# Patient Record
Sex: Female | Born: 1980
Health system: Southern US, Community
[De-identification: ages and names within clinical notes are randomized; demographics above are authoritative.]

## PROBLEM LIST (undated history)

## (undated) DIAGNOSIS — N6009 Solitary cyst of unspecified breast: Secondary | ICD-10-CM

## (undated) DIAGNOSIS — D259 Leiomyoma of uterus, unspecified: Secondary | ICD-10-CM

## (undated) DIAGNOSIS — T7840XA Allergy, unspecified, initial encounter: Secondary | ICD-10-CM

## (undated) DIAGNOSIS — Z8632 Personal history of gestational diabetes: Secondary | ICD-10-CM

## (undated) HISTORY — DX: Solitary cyst of unspecified breast: N60.09

## (undated) HISTORY — DX: Leiomyoma of uterus, unspecified: D25.9

## (undated) HISTORY — DX: Allergy, unspecified, initial encounter: T78.40XA

## (undated) HISTORY — PX: EYE SURGERY: SHX253

## (undated) HISTORY — DX: Personal history of gestational diabetes: Z86.32

---

## 1998-04-08 DIAGNOSIS — H269 Unspecified cataract: Secondary | ICD-10-CM

## 1998-04-08 HISTORY — DX: Unspecified cataract: H26.9

## 1998-09-16 ENCOUNTER — Emergency Department (HOSPITAL_COMMUNITY): Admission: EM | Admit: 1998-09-16 | Discharge: 1998-09-16 | Payer: Self-pay | Admitting: Emergency Medicine

## 1999-08-09 ENCOUNTER — Emergency Department (HOSPITAL_COMMUNITY): Admission: EM | Admit: 1999-08-09 | Discharge: 1999-08-09 | Payer: Self-pay | Admitting: Emergency Medicine

## 2000-07-17 ENCOUNTER — Other Ambulatory Visit: Admission: RE | Admit: 2000-07-17 | Discharge: 2000-07-17 | Payer: Self-pay | Admitting: *Deleted

## 2001-08-12 ENCOUNTER — Other Ambulatory Visit: Admission: RE | Admit: 2001-08-12 | Discharge: 2001-08-12 | Payer: Self-pay | Admitting: Obstetrics and Gynecology

## 2002-10-12 ENCOUNTER — Other Ambulatory Visit: Admission: RE | Admit: 2002-10-12 | Discharge: 2002-10-12 | Payer: Self-pay | Admitting: Obstetrics and Gynecology

## 2003-10-14 ENCOUNTER — Other Ambulatory Visit: Admission: RE | Admit: 2003-10-14 | Discharge: 2003-10-14 | Payer: Self-pay | Admitting: Obstetrics and Gynecology

## 2004-11-05 ENCOUNTER — Other Ambulatory Visit: Admission: RE | Admit: 2004-11-05 | Discharge: 2004-11-05 | Payer: Self-pay | Admitting: Obstetrics and Gynecology

## 2005-12-18 ENCOUNTER — Other Ambulatory Visit: Admission: RE | Admit: 2005-12-18 | Discharge: 2005-12-18 | Payer: Self-pay | Admitting: Obstetrics & Gynecology

## 2005-12-18 ENCOUNTER — Other Ambulatory Visit: Admission: RE | Admit: 2005-12-18 | Discharge: 2005-12-18 | Payer: Self-pay | Admitting: Obstetrics and Gynecology

## 2007-02-24 ENCOUNTER — Encounter: Admission: RE | Admit: 2007-02-24 | Discharge: 2007-02-24 | Payer: Self-pay | Admitting: Obstetrics & Gynecology

## 2007-05-04 ENCOUNTER — Inpatient Hospital Stay (HOSPITAL_COMMUNITY): Admission: AD | Admit: 2007-05-04 | Discharge: 2007-05-07 | Payer: Self-pay | Admitting: Obstetrics & Gynecology

## 2007-05-14 ENCOUNTER — Ambulatory Visit: Admission: RE | Admit: 2007-05-14 | Discharge: 2007-05-14 | Payer: Self-pay | Admitting: Obstetrics & Gynecology

## 2010-08-21 NOTE — Op Note (Signed)
Shannon Lawrence, Shannon Lawrence             ACCOUNT NO.:  [id number]   MEDICAL RECORD NO.:  [id number]          PATIENT TYPE:  INP   LOCATION:  9125                          FACILITY:  [facility]   PHYSICIAN:  Lenoard Aden, M.D.DATE OF BIRTH:  1981-03-03   DATE OF PROCEDURE:  05/05/2007  DATE OF DISCHARGE:                               OPERATIVE REPORT   INDICATION FOR OPERATIVE DELIVERY:  Prolonged second stage.   POSTOPERATIVE DIAGNOSIS:  Prolonged second stage.   PROCEDURE:  Outlet vacuum-assisted vaginal delivery.   SURGEON:  Lenoard Aden, M.D.   ANESTHESIA:  Epidural.   DRAINS:  Foley.   COUNTS:  Correct.   Central median episiotomy with repair, the patient recovery in good  condition.   DESCRIPTION OF PROCEDURE:  After apprised of risks, benefits of vacuum  assistance including small incidence of cephalohematoma, scalp  laceration, intracranial hemorrhage, Kiwi cup was placed on the fetal  vertex at +3 station, straight OA for three pulls over a central midline  episiotomy without extension, full-term living female, Apgars 8/9.  Cord  blood collected.  Placenta delivered spontaneously intact, three-vessel  cord.  Cervix without lacerations, repair with 3-0 Vicryl Rapide.  No  complications noted.  The patient tolerates procedure well and is  recovering in good condition.      Lenoard Aden, M.D.  Electronically Signed     RJT/MEDQ  D:  05/05/2007  T:  05/05/2007  Job:  [id number]

## 2010-08-21 NOTE — H&P (Signed)
[name], Shannon Lawrence             ACCOUNT NO.:  [id number]   MEDICAL RECORD NO.:  [id number]          PATIENT TYPE:  INP   LOCATION:  9166                          FACILITY:  [facility]   PHYSICIAN:  Lenoard Aden, M.D.DATE OF BIRTH:  [birth year]   DATE OF ADMISSION:  05/04/2007  DATE OF DISCHARGE:                              HISTORY & PHYSICAL   CHIEF COMPLAINT:  Early labor.   She is a 30 year old white female, G1, P0 at 38+ weeks' gestation who  presents with increased frequency of contractions today.  SHE HAS NO  KNOW LATEX ALLERGIES.  ALLERGIES TO SULFA NOTED.  She is a nonsmoker,  nondrinker.  She denies domestic or physical violence.  She has a  history of cataract surgery and wisdom tooth extraction.  She has family  history of rheumatoid arthritis, alcohol abuse, and nicotine dependence.  This is her first pregnancy.  Prenatal course is complicated by  gestational diabetes which is diet controlled and a fetus with single  umbilical artery with a normal level 2 maternal fetal ultrasound.   PHYSICAL EXAM:  She is a well-developed, well-nourished white female in  no acute distress.  HEENT:  Normal.  LUNGS:  Clear.  HEART:  Regular rate and rhythm.  ABDOMEN:  Soft, gravid, nontender.  Estimated fetal weight by ultrasound  on May 01, 2007, 7 pounds 11 ounces.  No CVA tenderness.  Cervix is  4 to 5 cm, 100% vertex -1.  EXTREMITIES:  No cords.  NEUROLOGIC:  Exam is nonfocal.  SKIN:  Intact.   NST is reactive.   Contractions are every 3 to 6 minutes.   IMPRESSION:  Term intrauterine pregnancy in early active labor.   PLAN:  Admit.  Check blood glucoses q.6.  Anticipate attempts at vaginal  delivery.      Lenoard Aden, M.D.  Electronically Signed     RJT/MEDQ  D:  05/05/2007  T:  05/05/2007  Job:  [id number]   cc:   Lenoard Aden, M.D.  Fax: [phone]

## 2010-12-27 LAB — CBC
HCT: 34.8 — ABNORMAL LOW
HCT: 39.4
MCHC: 34.4
MCHC: 35.1
MCV: 93.7
MCV: 95
Platelets: 243
RBC: 4.2
RDW: 13.6

## 2010-12-27 LAB — RPR: RPR Ser Ql: NONREACTIVE

## 2011-04-09 NOTE — L&D Delivery Note (Signed)
Delivery Note At 6:14 AM a healthy female was delivered via Vaginal, Spontaneous Delivery (Presentation: ; Occiput Anterior).  APGAR: 9, 9; weight .   Placenta status: Intact, Spontaneous.  Cord: 3 vessels with the following complications: None.  Cord pH: not done  Anesthesia: None  Episiotomy: None Lacerations: 2nd degree;Perineal Suture Repair: 2.0, 4-0 Vycryl Est. Blood Loss (mL): 300  Mom to postpartum.  Baby to nursery-stable. Consent obtained for circumcision.  Ulices Maack,MARIE-LYNE 12/19/2011, 7:09 AM

## 2011-05-22 LAB — OB RESULTS CONSOLE RPR: RPR: NONREACTIVE

## 2011-05-22 LAB — OB RESULTS CONSOLE HEPATITIS B SURFACE ANTIGEN: Hepatitis B Surface Ag: NEGATIVE

## 2011-05-22 LAB — OB RESULTS CONSOLE GBS: GBS: NEGATIVE

## 2011-05-22 LAB — OB RESULTS CONSOLE ABO/RH: RH Type: POSITIVE

## 2011-05-29 LAB — OB RESULTS CONSOLE RUBELLA ANTIBODY, IGM: Rubella: IMMUNE

## 2011-05-29 LAB — OB RESULTS CONSOLE HEPATITIS B SURFACE ANTIGEN: Hepatitis B Surface Ag: NEGATIVE

## 2011-05-29 LAB — OB RESULTS CONSOLE ABO/RH

## 2011-05-29 LAB — OB RESULTS CONSOLE GC/CHLAMYDIA
Chlamydia: NEGATIVE
Gonorrhea: NEGATIVE

## 2011-05-29 LAB — OB RESULTS CONSOLE ANTIBODY SCREEN: Antibody Screen: NEGATIVE

## 2011-10-03 ENCOUNTER — Encounter: Payer: Self-pay | Admitting: Obstetrics & Gynecology

## 2011-10-03 DIAGNOSIS — N921 Excessive and frequent menstruation with irregular cycle: Secondary | ICD-10-CM | POA: Insufficient documentation

## 2011-10-03 NOTE — Progress Notes (Deleted)
History:  31 y.o. G2P0011 here today for evaluation of abnormal uterine bleeding on Mirena IUD.  Reports having two periods a month for the past six months, separated by 2 weeks or less.  Wants further evaluation. No associated symptoms.  The following portions of the patient's history were reviewed and updated as appropriate: allergies, current medications, past family history, past medical history, past social history, past surgical history and problem list.  Review of Systems:  Pertinent items are noted in HPI.  Objective:  Physical Exam Blood pressure 136/99, pulse 99, height 5\' 6"  (1.676 m), weight 257 lb (116.574 kg). Gen: NAD Abd: Soft, nontender and nondistended Pelvic: Normal appearing external genitalia; normal appearing vaginal mucosa and cervix, IUD strings not seen.  Normal discharge.  Small uterus, no other palpable masses, no uterine or adnexal tenderness  Assessment & Plan:  Recommended treating the breakthrough bleeding with additional hormones; one pack of OCPs was given to patient.  She was told to expect breakthrough bleeding at the end of the pack, and that her irregular spotting may be alleviated using this modality. Will monitor her response.  Pelvic ultrasound also ordered to evaluate IUD position and other uterine anomalies.  It was also emphasized the irregular bleeding was a known side effect of the Mirena IUD. Bleeding precautions reviewed.   Follow up results and manage accordingly. Patient to return for follow up and annual exam   .

## 2011-10-04 NOTE — Progress Notes (Signed)
This encounter was created in error - please disregard.

## 2011-10-23 ENCOUNTER — Encounter: Payer: 59 | Attending: Obstetrics & Gynecology | Admitting: *Deleted

## 2011-10-23 VITALS — Ht 64.0 in | Wt 154.8 lb

## 2011-10-23 DIAGNOSIS — O24419 Gestational diabetes mellitus in pregnancy, unspecified control: Secondary | ICD-10-CM

## 2011-10-23 DIAGNOSIS — O99810 Abnormal glucose complicating pregnancy: Secondary | ICD-10-CM | POA: Insufficient documentation

## 2011-10-23 DIAGNOSIS — Z713 Dietary counseling and surveillance: Secondary | ICD-10-CM | POA: Insufficient documentation

## 2011-10-24 ENCOUNTER — Encounter: Payer: Self-pay | Admitting: *Deleted

## 2011-10-24 NOTE — Progress Notes (Signed)
  Patient was seen on 10/23/11 for Gestational Diabetes self-management class at the [facility]. The following learning objectives were met by the patient during this course:   States the definition of Gestational Diabetes  States why dietary management is important in controlling blood glucose  Describes the effects each nutrient has on blood glucose levels  Demonstrates ability to create a balanced meal plan  Demonstrates carbohydrate counting   States when to check blood glucose levels  Demonstrates proper blood glucose monitoring techniques  States the effect of stress and exercise on blood glucose levels  States the importance of limiting caffeine and abstaining from alcohol and smoking  Blood glucose monitor given: One Touch Ultra Mini Self Monitoring Kit  Lot # [id number]  Exp: 1/14 Blood glucose reading: 63 mg/dl  Patient instructed to monitor glucose levels: FBS: 60 - <90 2 hour: <120  *Patient received handouts:  Nutrition Diabetes and Pregnancy  Carbohydrate Counting List  Patient will be seen for follow-up as needed.

## 2011-10-24 NOTE — Patient Instructions (Signed)
Goals:  Check glucose levels per MD as instructed  Follow Gestational Diabetes Diet as instructed  Call for follow-up as needed    

## 2011-12-05 LAB — OB RESULTS CONSOLE GBS: GBS: NEGATIVE

## 2011-12-19 ENCOUNTER — Encounter (HOSPITAL_COMMUNITY): Payer: Self-pay | Admitting: *Deleted

## 2011-12-19 ENCOUNTER — Inpatient Hospital Stay (HOSPITAL_COMMUNITY)
Admission: AD | Admit: 2011-12-19 | Discharge: 2011-12-20 | DRG: 775 | Disposition: A | Discharge status: Home / Self Care | Payer: 59 | Source: Ambulatory Visit | Attending: Obstetrics & Gynecology | Admitting: Obstetrics & Gynecology

## 2011-12-19 DIAGNOSIS — O99814 Abnormal glucose complicating childbirth: Principal | ICD-10-CM | POA: Diagnosis present

## 2011-12-19 LAB — CBC
HCT: 38.6 % (ref 36.0–46.0)
Hemoglobin: 13.1 g/dL (ref 12.0–15.0)
MCH: 29.6 pg (ref 26.0–34.0)
MCHC: 33.9 g/dL (ref 30.0–36.0)
MCV: 87.1 fL (ref 78.0–100.0)
Platelets: 268 K/uL (ref 150–400)
RBC: 4.43 MIL/uL (ref 3.87–5.11)
RDW: 13.4 % (ref 11.5–15.5)
WBC: 12 K/uL — ABNORMAL HIGH (ref 4.0–10.5)

## 2011-12-19 MED ORDER — LIDOCAINE HCL (PF) 1 % IJ SOLN
INTRAMUSCULAR | Status: AC
  Filled 2011-12-19: qty 30

## 2011-12-19 MED ORDER — IBUPROFEN 600 MG PO TABS
600.0000 mg | ORAL_TABLET | Freq: Four times a day (QID) | ORAL | Status: DC
  Administered 2011-12-19 – 2011-12-20 (×6): 600 mg via ORAL
  Filled 2011-12-19 (×6): qty 1

## 2011-12-19 MED ORDER — ONDANSETRON HCL 4 MG PO TABS: 4.0000 mg | ORAL_TABLET | ORAL | Status: DC | PRN

## 2011-12-19 MED ORDER — ZOLPIDEM TARTRATE 5 MG PO TABS: 5.0000 mg | ORAL_TABLET | Freq: Every evening | ORAL | Status: DC | PRN

## 2011-12-19 MED ORDER — WITCH HAZEL-GLYCERIN EX PADS: 1.0000 "application " | MEDICATED_PAD | CUTANEOUS | Status: DC | PRN

## 2011-12-19 MED ORDER — ACETAMINOPHEN 325 MG PO TABS: 650.0000 mg | ORAL_TABLET | ORAL | Status: DC | PRN

## 2011-12-19 MED ORDER — LACTATED RINGERS IV SOLN: 500.0000 mL | Freq: Once | INTRAVENOUS | Status: DC

## 2011-12-19 MED ORDER — HYDROMORPHONE HCL PF 1 MG/ML IJ SOLN
INTRAMUSCULAR | Status: AC
  Filled 2011-12-19: qty 1

## 2011-12-19 MED ORDER — OXYTOCIN 40 UNITS IN LACTATED RINGERS INFUSION - SIMPLE MED
INTRAVENOUS | Status: AC
  Filled 2011-12-19: qty 1000

## 2011-12-19 MED ORDER — DIPHENHYDRAMINE HCL 25 MG PO CAPS: 25.0000 mg | ORAL_CAPSULE | Freq: Four times a day (QID) | ORAL | Status: DC | PRN

## 2011-12-19 MED ORDER — ONDANSETRON HCL 4 MG/2ML IJ SOLN: 4.0000 mg | INTRAMUSCULAR | Status: DC | PRN

## 2011-12-19 MED ORDER — CITRIC ACID-SODIUM CITRATE 334-500 MG/5ML PO SOLN: 30.0000 mL | ORAL | Status: DC | PRN

## 2011-12-19 MED ORDER — COMPLETENATE 29-1 MG PO CHEW
1.0000 | CHEWABLE_TABLET | Freq: Every day | ORAL | Status: DC
  Administered 2011-12-19 – 2011-12-20 (×2): 1 via ORAL
  Filled 2011-12-19 (×3): qty 1

## 2011-12-19 MED ORDER — PHENYLEPHRINE 40 MCG/ML (10ML) SYRINGE FOR IV PUSH (FOR BLOOD PRESSURE SUPPORT): 80.0000 ug | PREFILLED_SYRINGE | INTRAVENOUS | Status: DC | PRN

## 2011-12-19 MED ORDER — BENZOCAINE-MENTHOL 20-0.5 % EX AERO
1.0000 "application " | INHALATION_SPRAY | CUTANEOUS | Status: DC | PRN
  Administered 2011-12-19: 1 via TOPICAL
  Filled 2011-12-19: qty 56

## 2011-12-19 MED ORDER — FENTANYL 2.5 MCG/ML BUPIVACAINE 1/10 % EPIDURAL INFUSION (WH - ANES): 14.0000 mL/h | INTRAMUSCULAR | Status: DC

## 2011-12-19 MED ORDER — LACTATED RINGERS IV SOLN: INTRAVENOUS | Status: DC

## 2011-12-19 MED ORDER — LACTATED RINGERS IV SOLN: 500.0000 mL | INTRAVENOUS | Status: DC | PRN

## 2011-12-19 MED ORDER — EPHEDRINE 5 MG/ML INJ: 10.0000 mg | INTRAVENOUS | Status: DC | PRN

## 2011-12-19 MED ORDER — DIBUCAINE 1 % RE OINT: 1.0000 "application " | TOPICAL_OINTMENT | RECTAL | Status: DC | PRN

## 2011-12-19 MED ORDER — HYDROMORPHONE HCL PF 1 MG/ML IJ SOLN
1.0000 mg | Freq: Once | INTRAMUSCULAR | Status: AC
  Administered 2011-12-19: 1 mg via INTRAVENOUS

## 2011-12-19 MED ORDER — OXYCODONE-ACETAMINOPHEN 5-325 MG PO TABS: 1.0000 | ORAL_TABLET | ORAL | Status: DC | PRN

## 2011-12-19 MED ORDER — TETANUS-DIPHTH-ACELL PERTUSSIS 5-2.5-18.5 LF-MCG/0.5 IM SUSP: 0.5000 mL | Freq: Once | INTRAMUSCULAR | Status: DC

## 2011-12-19 MED ORDER — PRENATAL MULTIVITAMIN CH
1.0000 | ORAL_TABLET | Freq: Every day | ORAL | Status: DC
  Filled 2011-12-19: qty 1

## 2011-12-19 MED ORDER — SIMETHICONE 80 MG PO CHEW: 80.0000 mg | CHEWABLE_TABLET | ORAL | Status: DC | PRN

## 2011-12-19 MED ORDER — ONDANSETRON HCL 4 MG/2ML IJ SOLN: 4.0000 mg | Freq: Four times a day (QID) | INTRAMUSCULAR | Status: DC | PRN

## 2011-12-19 MED ORDER — DIPHENHYDRAMINE HCL 50 MG/ML IJ SOLN: 12.5000 mg | INTRAMUSCULAR | Status: DC | PRN

## 2011-12-19 MED ORDER — OXYTOCIN BOLUS FROM INFUSION
500.0000 mL | Freq: Once | INTRAVENOUS | Status: DC
  Filled 2011-12-19: qty 500

## 2011-12-19 MED ORDER — LANOLIN HYDROUS EX OINT: TOPICAL_OINTMENT | CUTANEOUS | Status: DC | PRN

## 2011-12-19 MED ORDER — SENNOSIDES-DOCUSATE SODIUM 8.6-50 MG PO TABS
2.0000 | ORAL_TABLET | Freq: Every day | ORAL | Status: DC
  Administered 2011-12-19: 2 via ORAL

## 2011-12-19 MED ORDER — OXYTOCIN 40 UNITS IN LACTATED RINGERS INFUSION - SIMPLE MED: 62.5000 mL/h | Freq: Once | INTRAVENOUS | Status: DC

## 2011-12-19 MED ORDER — IBUPROFEN 600 MG PO TABS: 600.0000 mg | ORAL_TABLET | Freq: Four times a day (QID) | ORAL | Status: DC | PRN

## 2011-12-19 MED ORDER — LIDOCAINE HCL (PF) 1 % IJ SOLN
30.0000 mL | INTRAMUSCULAR | Status: AC | PRN
  Administered 2011-12-19 (×2): 30 mL via SUBCUTANEOUS
  Filled 2011-12-19: qty 30

## 2011-12-19 MED ORDER — OXYTOCIN 40 UNITS IN LACTATED RINGERS INFUSION - SIMPLE MED: 62.5000 mL/h | INTRAVENOUS | Status: DC | PRN

## 2011-12-19 NOTE — H&P (Signed)
Subjective:  Shannon Lawrence is a 31 y.o. G14 P1A2 female with EDC 12/28/2011 at 29 and 5/[redacted] weeks gestation who is being admitted for labor management.  Her current obstetrical history is significant for GDMA1 well controled.  Patient reports no complaints.   Fetal Movement: normal.     Objective:   Vital signs in last 24 hours: Pulse Rate:  [80] 80  (09/12 0631) Resp:  [18] 18  (09/12 0631) BP: (120)/(55) 120/55 mmHg (09/12 0631) Weight:  [70.761 kg (156 lb)] 70.761 kg (156 lb) (09/12 1610)   General:   alert  Skin:   normal  HEENT:  PERRLA  Lungs:   clear to auscultation bilaterally  Heart:   regular rate and rhythm, S1, S2 normal, no murmur, click, rub or gallop  Breasts:   normal without suspicious masses, skin or nipple changes or axillary nodes and self-exam is taught and encouraged  Abdomen:  soft, non-tender; bowel sounds normal; no masses,  no organomegaly  Pelvis:  Deferred  FHT:  140's BPM  Uterine Size: size equals dates  Presentations: cephalic  Cervix:    Dilation: 8-9 cm   Effacement: 100%   Station:  0   Consistency: soft   Position: anterior   Lab Review  Rh+  AFP: Declined  One hour GTT: Abnormal , 3 hr GTT Abnormal, GDMA1  GBS neg  [place] anato wnl   Assessment/Plan:  38 and 5/[redacted] weeks gestation.  GDMA1 well controled.  AGA per [place].  Fetal well-being reassuring. Active phase labor.     Risks, benefits, alternatives and possible complications have been discussed in detail with the patient.  Pre-admission, admission, and post admission procedures and expectations were discussed in detail.  All questions answered, all appropriate consents will be signed at the Hospital. Admission is planned for today.  Anticipate vaginal delivery.

## 2011-12-19 NOTE — MAU Note (Signed)
Patients states that she started contracting at 0345 am.

## 2011-12-20 LAB — CBC
HCT: 32 % — ABNORMAL LOW (ref 36.0–46.0)
Hemoglobin: 10.9 g/dL — ABNORMAL LOW (ref 12.0–15.0)
MCV: 88.6 fL (ref 78.0–100.0)
Platelets: 195 10*3/uL (ref 150–400)
RBC: 3.61 MIL/uL — ABNORMAL LOW (ref 3.87–5.11)
WBC: 13.2 10*3/uL — ABNORMAL HIGH (ref 4.0–10.5)

## 2011-12-20 NOTE — Progress Notes (Signed)
Patient ID: Shannon Lawrence, female   DOB: 15-May-1980, 31 y.o.   MRN: [id number] PPD # 1  Subjective: Pt reports feeling well/ Pain controlled with ibuprofen Tolerating po/ Voiding without problems/ No n/v Bleeding is moderate Newborn info:  Information for the patient's newborn:  [name], [name] [[id number]]  female  / circ today/ Feeding: breast   Objective:  VS: Blood pressure 100/56, pulse 69, temperature 98.6 F (37 C), temperature source Oral, resp. rate 18.    Basename 12/20/11 0455 12/19/11 0558  WBC 13.2* 12.0*  HGB 10.9* 13.1  HCT 32.0* 38.6  PLT 195 268    Blood type: --/--/O POS (09/12 8341) Rubella: Immune (02/20 0000)    Physical Exam:  General: A & O x 3  alert, cooperative and no distress CV: Regular rate and rhythm Resp: clear Abdomen: soft, nontender, normal bowel sounds Uterine Fundus: firm, below umbilicus, nontender Perineum: minimal edema; well approximated Lochia: moderate Ext: no edema, redness or tenderness in the calves or thighs   A/P: PPD # 1/ G4P2022/ S/P:spontaneous vaginal delivery w/2nd degree repair Doing well; stable for early d/c home, however 31yo at home with fever and to see peds this am.  Will wait for peds report and possible clearance for d/c later today Continue routine post partum orders    Demetrius Revel, MSN, [facility] 12/20/2011, 9:15 AM

## 2012-01-28 NOTE — Discharge Summary (Signed)
Obstetric Discharge Summary  Reason for Admission: onset of labor and FT gestation with hx GDM A1 Prenatal Procedures: ultrasound Intrapartum Procedures: spontaneous vaginal delivery Postpartum Procedures: none Complications-Operative and Postpartum: 2nd degree perineal laceration Hemoglobin  Date Value Range Status  12/20/2011 10.9* 12.0 - 15.0 g/dL Final     REPEATED TO VERIFY     DELTA CHECK NOTED     HCT  Date Value Range Status  12/20/2011 32.0* 36.0 - 46.0 % Final    Physical Exam:  General: alert, cooperative and no distress Lochia: appropriate Uterine Fundus: firm Incision: na DVT Evaluation: No evidence of DVT seen on physical exam.  Discharge Diagnoses: Term Pregnancy-delivered  Discharge Information: Date: 01/19/12 Activity: pelvic rest Diet: routine Medications: PNV, Ibuprofen and Percocet Condition: stable Instructions: refer to practice specific booklet Discharge to: home   Newborn Data: Live born female on 12/19/11 Birth Weight: 6 lb 10.9 oz (3030 g) APGAR: 9, 9  Home with mother.  Darrah Dredge K 01/19/12

## 2014-02-07 ENCOUNTER — Encounter (HOSPITAL_COMMUNITY): Payer: Self-pay | Admitting: *Deleted

## 2016-05-09 ENCOUNTER — Ambulatory Visit (INDEPENDENT_AMBULATORY_CARE_PROVIDER_SITE_OTHER): Payer: Commercial Managed Care - HMO | Admitting: Family Medicine

## 2016-05-09 ENCOUNTER — Encounter: Payer: Self-pay | Admitting: Family Medicine

## 2016-05-09 VITALS — BP 108/72 | HR 78 | Temp 98.6°F | Ht 64.0 in | Wt 135.0 lb

## 2016-05-09 DIAGNOSIS — B354 Tinea corporis: Secondary | ICD-10-CM | POA: Diagnosis not present

## 2016-05-09 DIAGNOSIS — R1084 Generalized abdominal pain: Secondary | ICD-10-CM | POA: Diagnosis not present

## 2016-05-09 LAB — CBC WITH DIFFERENTIAL/PLATELET
Basophils Absolute: 0.1 10*3/uL (ref 0.0–0.1)
Basophils Relative: 1.1 % (ref 0.0–3.0)
Eosinophils Absolute: 0.3 10*3/uL (ref 0.0–0.7)
Eosinophils Relative: 4.8 % (ref 0.0–5.0)
HCT: 41.8 % (ref 36.0–46.0)
Hemoglobin: 14.2 g/dL (ref 12.0–15.0)
Lymphocytes Relative: 19.5 % (ref 12.0–46.0)
Lymphs Abs: 1.4 10*3/uL (ref 0.7–4.0)
MCHC: 33.9 g/dL (ref 30.0–36.0)
MCV: 90.4 fl (ref 78.0–100.0)
Monocytes Absolute: 0.5 10*3/uL (ref 0.1–1.0)
Monocytes Relative: 7 % (ref 3.0–12.0)
Neutro Abs: 4.9 10*3/uL (ref 1.4–7.7)
Neutrophils Relative %: 67.6 % (ref 43.0–77.0)
Platelets: 366 10*3/uL (ref 150.0–400.0)
RBC: 4.62 Mil/uL (ref 3.87–5.11)
RDW: 12.9 % (ref 11.5–15.5)
WBC: 7.2 10*3/uL (ref 4.0–10.5)

## 2016-05-09 LAB — COMPREHENSIVE METABOLIC PANEL
ALT: 15 U/L (ref 0–35)
AST: 24 U/L (ref 0–37)
Albumin: 4.7 g/dL (ref 3.5–5.2)
Alkaline Phosphatase: 56 U/L (ref 39–117)
BUN: 11 mg/dL (ref 6–23)
CO2: 28 mEq/L (ref 19–32)
Calcium: 9.8 mg/dL (ref 8.4–10.5)
Chloride: 106 mEq/L (ref 96–112)
Creatinine, Ser: 0.8 mg/dL (ref 0.40–1.20)
GFR: 85.61 mL/min (ref >60.00)
Glucose, Bld: 92 mg/dL (ref 70–99)
Potassium: 4.4 mEq/L (ref 3.5–5.1)
Sodium: 139 mEq/L (ref 135–145)
Total Bilirubin: 0.8 mg/dL (ref 0.2–1.2)
Total Protein: 7.8 g/dL (ref 6.0–8.3)

## 2016-05-09 LAB — POCT URINE PREGNANCY: Preg Test, Ur: NEGATIVE

## 2016-05-09 MED ORDER — CLOTRIMAZOLE-BETAMETHASONE 1-0.05 % EX CREA: 1.0000 "application " | TOPICAL_CREAM | Freq: Two times a day (BID) | CUTANEOUS | 0 refills | Status: DC

## 2016-05-09 NOTE — Progress Notes (Signed)
Pre visit review using our clinic review tool, if applicable. No additional management support is needed unless otherwise documented below in the visit note. 

## 2016-05-09 NOTE — Progress Notes (Signed)
Shannon Lawrence is a 36 y.o. female is here to [facility].   History of Present Illness:    1. Generalized abdominal pain: Mild, "feels more like a sensitive stomach." Had possible GE a few days ago and just hasn't improved to normal yet. Daughter has upcoming surgical procedure, so she is also wondering if this is IBS. No fever, N/V/D/C. Eating and drinking almost normally now. No travel. No new foods. No sick contacts.   2. Tinea corporis: Left axilla, x months, itchy. She has tried "psoriasis cream" without relief. No other lesions or rashes. No Hx of the same.     PMHx, SurgHx, SocialHx, Medications, and Allergies were reviewed in the Visit Navigator and updated as appropriate.    Past Medical History:  Diagnosis Date  . Cyst (solitary) of breast left breast  . Diabetes mellitus   . Migraine     History reviewed. No pertinent surgical history.  Family History  Problem Relation Age of Onset  . Brain cancer Maternal Grandmother   . Cancer Other   . COPD Other   . Hyperlipidemia Other     Social History  Substance Use Topics  . Smoking status: Never Smoker  . Smokeless tobacco: Never Used  . Alcohol use Yes     Comment: social     Current Medications and Allergies:    Current Outpatient Prescriptions:  .  clotrimazole-betamethasone (LOTRISONE) cream, Apply 1 application topically 2 (two) times daily., Disp: 30 g, Rfl: 0   Allergies  Allergen Reactions  . Sulfa Drugs Cross Reactors Other (See Comments)    Reaction unknown      Patient Information Form: Screening and ROS    Do you feel safe in relationships? yes PHQ-2: negative  Review of Systems  General:  Negative for nexplained weight loss, fever Skin: Negative for new or changing mole, sore that won't heal HEENT: Negative for trouble hearing, trouble seeing, ringing in ears, mouth sores, hoarseness, change in voice, dysphagia CV:  Negative for chest pain, dyspnea, edema, palpitations Resp:  Negative for cough, dyspnea, hemoptysis GI: Negative for nausea, vomiting, diarrhea, constipation, melena, hematochezia GU: Negative for dysuria, incontinence, urinary hesitance, hematuria MSK: Negative for muscle cramps or aches, joint pain or swelling Neuro: Negative for headaches, weakness, numbness, dizziness, passing out/fainting Psych: Negative for depression, anxiety, memory problems   Vitals:   Vitals:   05/09/16 0855  BP: 108/72  Pulse: 78  Temp: 98.6 F (37 C)  TempSrc: Oral  SpO2: 100%  Weight: 135 lb (61.2 kg)  Height: 5\' 4"  (1.626 m)     Body mass index is 23.17 kg/m.   Physical Exam:    General: Alert, cooperative, appears stated age and no distress.  HEENT:  Normocephalic, without obvious abnormality, atraumatic. Conjunctivae/corneas clear. PERRL, EOM's intact. Normal TM's and external ear canals both ears. Nares normal. Septum midline. Mucosa normal. No drainage or sinus tenderness. Lips, mucosa, and tongue normal; teeth and gums normal.  Lungs: Clear to auscultation bilaterally.  Heart:: Regular rate and rhythm, S1, S2 normal, no murmur, click, rub or gallop.  Abdomen: Soft, non-tender; bowel sounds normal; no masses, no organomegaly.  Extremities: Extremities normal, atraumatic, no cyanosis or edema.  Pulses: 2+ and symmetric.  Skin: Skin color, texture, turgor normal. Left axilla with tinea.  Neurologic: Alert and oriented X 3, normal strength and tone. Normal symmetric. reflexes. Normal coordination and gait.  Psych: Alert,oriented, in NAD with a full range of affect, normal behavior and no psychotic features  Results for orders placed or performed in visit on 05/09/16 (from the past 48 hour(s))  POCT urine pregnancy     Status: Normal   Collection Time: 05/09/16  9:59 AM  Result Value Ref Range   Preg Test, Ur Negative Negative     Assessment and Plan:    Shannon Lawrence was seen today for establish care.  Diagnoses and all orders for this  visit:  Generalized abdominal pain Comments: Likely due to recent GI illness. DDx anxiety-related, food-related. Labs pending. Precuations and red flags reviewed. Orders: -     CBC with Differential/Platelet -     Comprehensive metabolic panel -     POCT urine pregnancy  Tinea corporis Comments: Trial of topical treatment. Orders: -     clotrimazole-betamethasone (LOTRISONE) cream; Apply 1 application topically 2 (two) times daily.   . Reviewed expectations re: course of current medical issues. . Discussed self-management of symptoms. . Outlined signs and symptoms indicating need for more acute intervention. . Patient verbalized understanding and all questions were answered. . See orders for this visit as documented in the electronic medical record. . Patient received an After Visit Summary.  Records requested if needed. I spent ** minutes with this patient, greater than 50% was face-to-face time counseling regarding the above diagnoses.    Briscoe Deutscher, [place], [place] 05/09/2016   Follow-up: Return if symptoms worsen or fail to improve.  Meds ordered this encounter  Medications  . clotrimazole-betamethasone (LOTRISONE) cream    Sig: Apply 1 application topically 2 (two) times daily.    Dispense:  30 g    Refill:  0   Medications Discontinued During This Encounter  Medication Reason  . Prenatal Vit-Fe Fumarate-FA (PRENATAL MULTIVITAMIN) TABS Error   Orders Placed This Encounter  Procedures  . CBC with Differential/Platelet  . Comprehensive metabolic panel  . POCT urine pregnancy

## 2016-07-18 ENCOUNTER — Encounter: Payer: Self-pay | Admitting: Obstetrics & Gynecology

## 2016-07-25 ENCOUNTER — Ambulatory Visit (INDEPENDENT_AMBULATORY_CARE_PROVIDER_SITE_OTHER): Payer: Commercial Managed Care - HMO | Admitting: Obstetrics & Gynecology

## 2016-07-25 ENCOUNTER — Encounter: Payer: Self-pay | Admitting: Obstetrics & Gynecology

## 2016-07-25 VITALS — BP 128/80 | Ht 64.0 in | Wt 130.0 lb

## 2016-07-25 DIAGNOSIS — N920 Excessive and frequent menstruation with regular cycle: Secondary | ICD-10-CM

## 2016-07-25 DIAGNOSIS — Z01411 Encounter for gynecological examination (general) (routine) with abnormal findings: Secondary | ICD-10-CM | POA: Diagnosis not present

## 2016-07-25 DIAGNOSIS — D259 Leiomyoma of uterus, unspecified: Secondary | ICD-10-CM | POA: Diagnosis not present

## 2016-07-25 LAB — CBC
HEMATOCRIT: 40.1 % (ref 35.0–45.0)
HEMOGLOBIN: 13.4 g/dL (ref 11.7–15.5)
MCH: 30.6 pg (ref 27.0–33.0)
MCHC: 33.4 g/dL (ref 32.0–36.0)
MCV: 91.6 fL (ref 80.0–100.0)
MPV: 9 fL (ref 7.5–12.5)
Platelets: 376 10*3/uL (ref 140–400)
RBC: 4.38 MIL/uL (ref 3.80–5.10)
RDW: 12.7 % (ref 11.0–15.0)
WBC: 6.5 10*3/uL (ref 3.8–10.8)

## 2016-07-25 NOTE — Addendum Note (Signed)
Addended by: Thurnell Garbe A on: 07/25/2016 12:22 PM   Modules accepted: Orders

## 2016-07-25 NOTE — Progress Notes (Signed)
Shannon Lawrence 04/25/1980 [id number]   History:    36 y.o.  G4P2A2L2 Married.  Sons are 4 yo and 4th grade.  Established patient for annual gyn exam.  Using condoms.  Menses regular q month, but progressively heavier flow.  H/O Fibroid.  Last period very heavy with severe dysmeno.  Pain in Rt hip on-off, will see Ortho.  Breasts wnl.  Past medical history,surgical history, family history and social history were all reviewed and documented in the EPIC chart.  Gynecologic History Patient's last menstrual period was 07/17/2016. Contraception: condoms, hoping for Vasectomy Last Pap: Uncertain 9528 or 4132. Results were: normal Last mammogram: Never.  Obstetric History OB History  Gravida Para Term Preterm AB Living  4 2 2   2 2   SAB TAB Ectopic Multiple Live Births  2       1    # Outcome Date GA Lbr Len/2nd Weight Sex Delivery Anes PTL Lv  4 Term 12/19/11 [redacted]w[redacted]d 02:19 / 00:10 6 lb 10.9 oz (3.03 kg) M Vag-Spont None  LIV  3 SAB 09/18/10          2 SAB 04/19/10          1 Term 04/20/07               ROS: A ROS was performed and pertinent positives and negatives are included in the history.  GENERAL: No fevers or chills. HEENT: No change in vision, no earache, sore throat or sinus congestion. NECK: No pain or stiffness. CARDIOVASCULAR: No chest pain or pressure. No palpitations. PULMONARY: No shortness of breath, cough or wheeze. GASTROINTESTINAL: No abdominal pain, nausea, vomiting or diarrhea, melena or bright red blood per rectum. GENITOURINARY: No urinary frequency, urgency, hesitancy or dysuria. MUSCULOSKELETAL: No joint or muscle pain, no back pain, no recent trauma. DERMATOLOGIC: No rash, no itching, no lesions. ENDOCRINE: No polyuria, polydipsia, no heat or cold intolerance. No recent change in weight. HEMATOLOGICAL: No anemia or easy bruising or bleeding. NEUROLOGIC: No headache, seizures, numbness, tingling or weakness. PSYCHIATRIC: No depression, no loss of interest in  normal activity or change in sleep pattern.     Exam:   BP 128/80   Ht 5\' 4"  (1.626 m)   Wt 130 lb (59 kg)   LMP 07/17/2016   BMI 22.31 kg/m   Body mass index is 22.31 kg/m.  General appearance : Well developed well nourished female. No acute distress HEENT: Eyes: no retinal hemorrhage or exudates,  Neck supple, trachea midline, no carotid bruits, no thyroidmegaly Lungs: Clear to auscultation, no rhonchi or wheezes, or rib retractions  Heart: Regular rate and rhythm, no murmurs or gallops Breast:Examined in sitting and supine position were symmetrical in appearance, no palpable masses or tenderness,  no skin retraction, no nipple inversion, no nipple discharge, no skin discoloration, no axillary or supraclavicular lymphadenopathy Abdomen: no palpable masses or tenderness, no rebound or guarding Extremities: no edema or skin discoloration or tenderness  Pelvic:  Bartholin, Urethra, Skene Glands: Within normal limits             Vagina: No gross lesions or discharge  Cervix: No gross lesions or discharge.  Pap reflex done.  Uterus  AV, increased in size, with a probable fibroid towards the Rt side.  Mobile, non-tender.  Adnexa  Without masses or tenderness  Anus and perineum  normal     Assessment/Plan:  36 y.o. female for annual exam.  1. Encounter for gynecological examination with abnormal finding Normal  Aex/Gyn exam except for Uterine Fibroid.  Pending Pap reflex.  2. Menorrhagia with regular cycle Progressively heavier periods, but reg qmth.  Increased dysmeno.  Management of heavy periods discussed including Progestin Pill, Mirena IUD, Endometrial Ablation, Myomectomy and Hysterectomy. - CBC - US Transvaginal Non-OB; Future  3. Uterine leiomyoma, unspecified location Known Myoma, will evaluate location and size in the context of heavy menses. - US Transvaginal Non-OB; Future  Counseling >50% x 10 min on above issues.  [name] MD, 10:04 AM 07/25/2016

## 2016-07-25 NOTE — Patient Instructions (Signed)
Your Annual/Gyn exam was normal today except for a Uterine Fibroid probably causing your heavy menses.  A Pap reflex and CBC were done, we will let you know the results as soon as available.  We will finalize our discussion on the treatment of your heavy menses at the time of the Pelvic US in about 2 weeks.  It was a pleasure to see you today!

## 2016-07-29 LAB — PAP IG W/ RFLX HPV ASCU

## 2016-08-07 ENCOUNTER — Ambulatory Visit (INDEPENDENT_AMBULATORY_CARE_PROVIDER_SITE_OTHER): Payer: Commercial Managed Care - HMO | Admitting: Obstetrics & Gynecology

## 2016-08-07 ENCOUNTER — Other Ambulatory Visit: Payer: Self-pay | Admitting: Obstetrics & Gynecology

## 2016-08-07 ENCOUNTER — Ambulatory Visit (INDEPENDENT_AMBULATORY_CARE_PROVIDER_SITE_OTHER): Payer: Commercial Managed Care - HMO

## 2016-08-07 DIAGNOSIS — R938 Abnormal findings on diagnostic imaging of other specified body structures: Secondary | ICD-10-CM | POA: Diagnosis not present

## 2016-08-07 DIAGNOSIS — N852 Hypertrophy of uterus: Secondary | ICD-10-CM | POA: Diagnosis not present

## 2016-08-07 DIAGNOSIS — N920 Excessive and frequent menstruation with regular cycle: Secondary | ICD-10-CM

## 2016-08-07 DIAGNOSIS — D251 Intramural leiomyoma of uterus: Secondary | ICD-10-CM

## 2016-08-07 DIAGNOSIS — R9389 Abnormal findings on diagnostic imaging of other specified body structures: Secondary | ICD-10-CM

## 2016-08-07 DIAGNOSIS — D259 Leiomyoma of uterus, unspecified: Secondary | ICD-10-CM

## 2016-08-07 DIAGNOSIS — D250 Submucous leiomyoma of uterus: Secondary | ICD-10-CM

## 2016-08-07 DIAGNOSIS — R102 Pelvic and perineal pain: Secondary | ICD-10-CM | POA: Diagnosis not present

## 2016-08-07 NOTE — Patient Instructions (Signed)
Your Pelvic US showed an Intramural Myoma towards the right side measuring 9.8 x 8.4 x 9.5 cm.  The Endometrial line was at 15.1 mm which is thick.  Rt and Lt Ovaries normal.  No Fluid in the pelvis.    Treatment options both Hormonal with Progestins and Surgical were discussed.  You will find information about DepoProvera below.    Given the thick Endometrial lining, please schedule an Endometrial biopsy as soon as possible if you opt not to have a surgical treatment.  Medroxyprogesterone injection [Contraceptive] What is this medicine? MEDROXYPROGESTERONE (me DROX ee proe JES te rone) contraceptive injections prevent pregnancy. They provide effective birth control for 3 months. Depo-subQ Provera 104 is also used for treating pain related to endometriosis and to treat women with heavy or painful menses. This medicine may be used for other purposes; ask your health care provider or pharmacist if you have questions. COMMON BRAND NAME(S): Depo-Provera, Depo-subQ Provera 104 What should I tell my health care provider before I take this medicine? They need to know if you have any of these conditions: -frequently drink alcohol -asthma -blood vessel disease or a history of a blood clot in the lungs or legs -bone disease such as osteoporosis -breast cancer -diabetes -eating disorder (anorexia nervosa or bulimia) -high blood pressure -HIV infection or AIDS -kidney disease -liver disease -mental depression -migraine -seizures (convulsions) -stroke -tobacco smoker -vaginal bleeding -an unusual or allergic reaction to medroxyprogesterone, other hormones, medicines, foods, dyes, or preservatives -pregnant or trying to get pregnant -breast-feeding How should I use this medicine? Depo-Provera Contraceptive injection is given into a muscle. Depo-subQ Provera 104 injection is given under the skin. These injections are given by a health care professional. You must not be pregnant before getting an  injection. The injection is usually given during the first 5 days after the start of a menstrual period or 6 weeks after delivery of a baby. Talk to your pediatrician regarding the use of this medicine in children. Special care may be needed. These injections have been used in female children who have started having menstrual periods. Overdosage: If you think you have taken too much of this medicine contact a poison control center or emergency room at once. NOTE: This medicine is only for you. Do not share this medicine with others. What if I miss a dose? Try not to miss a dose. You must get an injection once every 3 months to maintain birth control. If you cannot keep an appointment, call and reschedule it. If you wait longer than 13 weeks between Depo-Provera contraceptive injections or longer than 14 weeks between Depo-subQ Provera 104 injections, you could get pregnant. Use another method for birth control if you miss your appointment. You may also need a pregnancy test before receiving another injection. What may interact with this medicine? Do not take this medicine with any of the following medications: -bosentan This medicine may also interact with the following medications: -aminoglutethimide -antibiotics or medicines for infections, especially rifampin, rifabutin, rifapentine, and griseofulvin -aprepitant -barbiturate medicines such as phenobarbital or primidone -bexarotene -carbamazepine -medicines for seizures like ethotoin, felbamate, oxcarbazepine, phenytoin, topiramate -modafinil -St. John's wort This list may not describe all possible interactions. Give your health care provider a list of all the medicines, herbs, non-prescription drugs, or dietary supplements you use. Also tell them if you smoke, drink alcohol, or use illegal drugs. Some items may interact with your medicine. What should I watch for while using this medicine? This drug does not protect  you against HIV infection  (AIDS) or other sexually transmitted diseases. Use of this product may cause you to lose calcium from your bones. Loss of calcium may cause weak bones (osteoporosis). Only use this product for more than 2 years if other forms of birth control are not right for you. The longer you use this product for birth control the more likely you will be at risk for weak bones. Ask your health care professional how you can keep strong bones. You may have a change in bleeding pattern or irregular periods. Many females stop having periods while taking this drug. If you have received your injections on time, your chance of being pregnant is very low. If you think you may be pregnant, see your health care professional as soon as possible. Tell your health care professional if you want to get pregnant within the next year. The effect of this medicine may last a long time after you get your last injection. What side effects may I notice from receiving this medicine? Side effects that you should report to your doctor or health care professional as soon as possible: -allergic reactions like skin rash, itching or hives, swelling of the face, lips, or tongue -breast tenderness or discharge -breathing problems -changes in vision -depression -feeling faint or lightheaded, falls -fever -pain in the abdomen, chest, groin, or leg -problems with balance, talking, walking -unusually weak or tired -yellowing of the eyes or skin Side effects that usually do not require medical attention (report to your doctor or health care professional if they continue or are bothersome): -acne -fluid retention and swelling -headache -irregular periods, spotting, or absent periods -temporary pain, itching, or skin reaction at site where injected -weight gain This list may not describe all possible side effects. Call your doctor for medical advice about side effects. You may report side effects to FDA at 1-800-FDA-1088. Where should I  keep my medicine? This does not apply. The injection will be given to you by a health care professional. NOTE: This sheet is a summary. It may not cover all possible information. If you have questions about this medicine, talk to your doctor, pharmacist, or health care provider.  2018 Elsevier/Gold Standard (2008-04-15 18:37:56)

## 2016-08-07 NOTE — Progress Notes (Signed)
    Shannon Lawrence 03-06-1981 [id number]        36 y.o.  [id number]  RP:  Heavy periods and pelvic pain with Fibroids for Pelvic US   Past medical history,surgical history, problem list, medications, allergies, family history and social history were all reviewed and documented in the EPIC chart.  Directed ROS with pertinent positives and negatives documented in the history of present illness/assessment and plan.  Exam:  There were no vitals filed for this visit. General appearance:  Normal  Pelvic US today:  T/V and T/A:  Anteverted uterus with large IM Myoma 9.5 x 8.4 x 9.8 cm with uterine and Endometrial displacement.  Endometrial line thickened at 15.1 mm.  Rt and Lt Ovaries normal.  No FF in CDS.  Assessment/Plan:  36 y.o. [id number]   1. Menorrhagia with regular cycle Different medical/hormonal approaches discussed including Progestin IUD, BCPs and DepoProvera.  Will read on DepoProvera and call back when certain about which approach she prefers.  Endometrium is thickened at 15.1 mm, will proceed with EBx if non-surgical approach is chosen.  2. Intramural and submucous leiomyoma of uterus Large Myoma.  Surgical treatment with Myomectomy or Hysterectomy discussed.  Will consider after attempting medical/hormonal treatment if heavy menses or Rt pelvic pain persist.  DepoLupron also discussed.  3. Female pelvic pain Rt hip pain intermittently.  Could be caused by large Fibroid on Rt side or hip problem.  Will see Ortho.    Counseling >50% x 15 min on above issues and treatment options.  [name] MD, 4:03 PM 08/07/2016

## 2016-08-14 ENCOUNTER — Telehealth: Payer: Self-pay | Admitting: *Deleted

## 2016-08-14 NOTE — Telephone Encounter (Signed)
Pt called to follow up from [place] on 08/07/16 pt said she would like to proceed with hysterectomy. She asked if another office visit is the next step to discuss this? Please advise

## 2016-08-15 ENCOUNTER — Telehealth: Payer: Self-pay

## 2016-08-15 NOTE — Telephone Encounter (Signed)
Please schedule Robotic Total Laparoscopic Hysterectomy with Bilateral Salpingectomy at [facility].  2 hours.  Patient needs to schedule Preop with me.

## 2016-08-15 NOTE — Telephone Encounter (Signed)
I spoke with patient and we discussed dates for surgery. She prefers to wait until late July and she chose 11/05/16 at 7:30am at [facility].  Pre op was scheduled with Dr. Carlean Jews. We discussed her ins benefits and her estimated surgery prepymt and I will be sending her a financial letter by mail.

## 2016-08-15 NOTE — Telephone Encounter (Signed)
Left a detailed message on pt voicemail

## 2016-10-08 ENCOUNTER — Encounter: Payer: Self-pay | Admitting: Anesthesiology

## 2016-10-28 ENCOUNTER — Encounter: Payer: Self-pay | Admitting: Obstetrics & Gynecology

## 2016-10-28 ENCOUNTER — Encounter (HOSPITAL_COMMUNITY): Payer: Self-pay

## 2016-10-28 ENCOUNTER — Encounter (HOSPITAL_COMMUNITY)
Admission: RE | Admit: 2016-10-28 | Discharge: 2016-10-28 | Disposition: A | Discharge status: Home / Self Care | Payer: 59 | Source: Ambulatory Visit | Attending: Obstetrics & Gynecology | Admitting: Obstetrics & Gynecology

## 2016-10-28 ENCOUNTER — Ambulatory Visit (INDEPENDENT_AMBULATORY_CARE_PROVIDER_SITE_OTHER): Payer: 59 | Admitting: Obstetrics & Gynecology

## 2016-10-28 VITALS — BP 132/86

## 2016-10-28 DIAGNOSIS — Z0183 Encounter for blood typing: Secondary | ICD-10-CM | POA: Insufficient documentation

## 2016-10-28 DIAGNOSIS — D259 Leiomyoma of uterus, unspecified: Secondary | ICD-10-CM | POA: Insufficient documentation

## 2016-10-28 DIAGNOSIS — N920 Excessive and frequent menstruation with regular cycle: Secondary | ICD-10-CM | POA: Insufficient documentation

## 2016-10-28 DIAGNOSIS — D251 Intramural leiomyoma of uterus: Secondary | ICD-10-CM | POA: Diagnosis not present

## 2016-10-28 DIAGNOSIS — Z01812 Encounter for preprocedural laboratory examination: Secondary | ICD-10-CM | POA: Insufficient documentation

## 2016-10-28 DIAGNOSIS — R102 Pelvic and perineal pain: Secondary | ICD-10-CM

## 2016-10-28 LAB — CBC
HEMATOCRIT: 39.3 % (ref 36.0–46.0)
Hemoglobin: 13.5 g/dL (ref 12.0–15.0)
MCH: 30.5 pg (ref 26.0–34.0)
MCHC: 34.4 g/dL (ref 30.0–36.0)
MCV: 88.9 fL (ref 78.0–100.0)
Platelets: 341 10*3/uL (ref 150–400)
RBC: 4.42 MIL/uL (ref 3.87–5.11)
RDW: 13.5 % (ref 11.5–15.5)
WBC: 7.6 10*3/uL (ref 4.0–10.5)

## 2016-10-28 LAB — TYPE AND SCREEN
ABO/RH(D): O POS
Antibody Screen: NEGATIVE

## 2016-10-28 NOTE — Patient Instructions (Signed)
Your procedure is scheduled on:  Tuesday, November 05, 2016  Enter through the Main Entrance of [facility] at:  6:00 AM  Pick up the phone at the desk and dial [phone].  Call this number if you have problems the morning of surgery: [phone].  Remember: Do NOT eat food or drink after:  Midnight Monday  Take these medicines the morning of surgery with a SIP OF WATER:  None  Stop ALL herbal medications at this time  Do NOT smoke the day of surgery.  Do NOT wear jewelry (body piercing), metal hair clips/bobby pins, make-up, artifical eyelashes or nail polish. Do NOT wear lotions, powders, or perfumes.  You may wear deodorant. Do NOT shave for 48 hours prior to surgery. Do NOT bring valuables to the hospital. Contacts, dentures, or bridgework may not be worn into surgery.  Leave suitcase in car.  After surgery it may be brought to your room.  For patients admitted to the hospital, checkout time is 11:00 AM the day of discharge.   Bring a copy of your healthcare power of attorney and living will documents.

## 2016-10-28 NOTE — Progress Notes (Signed)
    Shannon Lawrence [birth year] [id number]        36 y.o.  [id number] Married  RP:  Large symptomatic uterine fibroid with menorrhagia and pelvic pain   HPI:  Persistent heavy menses with Rt Pelvic pain.    Pelvic US 08/07/2016:  T/V and T/A:  Anteverted uterus with large IM Myoma 9.5 x 8.4 x 9.8 cm with uterine and Endometrial displacement.  Endometrial line thickened at 15.1 mm.  Rt and Lt Ovaries normal.  No FF in CDS.    Past medical history,surgical history, problem list, medications, allergies, family history and social history were all reviewed and documented in the EPIC chart.  Directed ROS with pertinent positives and negatives documented in the history of present illness/assessment and plan.  Exam:  Vitals:   10/28/16 0902  BP: 132/86   General appearance:  Normal  Gyn exam:  See previous visits  Pelvic US as above  Assessment/Plan:  36 y.o. [id number]   1. Intramural leiomyoma of uterus Large symptomatic uterine myoma.  Declines medical approaches.  No desire to preserve fertility.  Decision to proceed with Robotic TLH/Bilateral Salpingectomy.  Preservation of Ovaries.  Surgery and risks reviewed as below.  2. Menorrhagia with regular cycle Persistent heavy menses associated with large IM Myoma.  3. Female pelvic pain Rt pelvic pain probably secondary to large uterine myoma towards Rt side.                        Patient was counseled as to the risk of surgery to include the following:  1. Infection (prohylactic antibiotics will be administered)  2. DVT/Pulmonary Embolism (prophylactic pneumo compression stockings will be used)  3.Trauma to internal organs requiring additional surgical procedure to repair any injury to     Internal organs requiring perhaps additional hospitalization days.  4.Hemmorhage requiring transfusion and blood products which carry risks such as anaphylactic reaction, hepatitis and AIDS  Patient had received literature information on the  procedure scheduled and all her questions were answered and fully accepts all risk.   Shannon LavoieMD9:34 AMTD     Shannon Bruins MD, 9:11 AM 10/28/2016

## 2016-10-28 NOTE — Patient Instructions (Signed)
1. Intramural leiomyoma of uterus Large symptomatic uterine myoma.  Declines medical approaches.  No desire to preserve fertility.  Decision to proceed with Robotic TLH/Bilateral Salpingectomy.  Preservation of Ovaries.  Surgery and risks reviewed as below.  2. Menorrhagia with regular cycle Persistent heavy menses associated with large IM Myoma.  3. Female pelvic pain Rt pelvic pain probably secondary to large uterine myoma towards Rt side.                        Patient was counseled as to the risk of surgery to include the following:  1. Infection (prohylactic antibiotics will be administered)  2. DVT/Pulmonary Embolism (prophylactic pneumo compression stockings will be used)  3.Trauma to internal organs requiring additional surgical procedure to repair any injury to     Internal organs requiring perhaps additional hospitalization days.  4.Hemmorhage requiring transfusion and blood products which carry risks such as anaphylactic reaction, hepatitis and AIDS  Patient had received literature information on the procedure scheduled and all her questions were answered and fully accepts all risk.   Shannon Lawrence, good to see you today!

## 2016-11-04 NOTE — Anesthesia Preprocedure Evaluation (Signed)
Anesthesia Evaluation  Patient identified by MRN, date of birth, ID band Patient awake    Reviewed: Allergy & Precautions, H&P , Patient's Chart, lab work & pertinent test results, reviewed documented beta blocker date and time   Airway Mallampati: II  TM Distance: >3 FB Neck ROM: full    Dental no notable dental hx.    Pulmonary    Pulmonary exam normal breath sounds clear to auscultation       Cardiovascular  Rhythm:regular Rate:Normal     Neuro/Psych    GI/Hepatic   Endo/Other  diabetes  Renal/GU      Musculoskeletal   Abdominal   Peds  Hematology   Anesthesia Other Findings   Reproductive/Obstetrics                             Anesthesia Physical Anesthesia Plan  ASA: II  Anesthesia Plan: General   Post-op Pain Management:    Induction: Intravenous  PONV Risk Score and Plan: 3 and Ondansetron, Dexamethasone, Midazolam and Propofol infusion  Airway Management Planned: Oral ETT  Additional Equipment:   Intra-op Plan:   Post-operative Plan: Extubation in OR  Informed Consent: I have reviewed the patients History and Physical, chart, labs and discussed the procedure including the risks, benefits and alternatives for the proposed anesthesia with the patient or authorized representative who has indicated his/her understanding and acceptance.   Dental Advisory Given  Plan Discussed with: CRNA and Surgeon  Anesthesia Plan Comments: (  )        Anesthesia Quick Evaluation

## 2016-11-05 ENCOUNTER — Inpatient Hospital Stay (HOSPITAL_COMMUNITY)
Admission: AD | Admit: 2016-11-05 | Discharge: 2016-11-07 | DRG: 743 | Disposition: A | Discharge status: Home / Self Care | Payer: 59 | Source: Ambulatory Visit | Attending: Obstetrics & Gynecology | Admitting: Obstetrics & Gynecology

## 2016-11-05 ENCOUNTER — Ambulatory Visit (HOSPITAL_COMMUNITY): Payer: 59 | Admitting: Anesthesiology

## 2016-11-05 ENCOUNTER — Encounter (HOSPITAL_COMMUNITY)
Admission: AD | Disposition: A | Discharge status: Home / Self Care | Payer: Self-pay | Source: Ambulatory Visit | Attending: Obstetrics & Gynecology

## 2016-11-05 ENCOUNTER — Encounter (HOSPITAL_COMMUNITY): Payer: Self-pay

## 2016-11-05 DIAGNOSIS — D259 Leiomyoma of uterus, unspecified: Secondary | ICD-10-CM | POA: Diagnosis not present

## 2016-11-05 DIAGNOSIS — D251 Intramural leiomyoma of uterus: Principal | ICD-10-CM | POA: Diagnosis present

## 2016-11-05 DIAGNOSIS — Z9889 Other specified postprocedural states: Secondary | ICD-10-CM

## 2016-11-05 DIAGNOSIS — Z5331 Laparoscopic surgical procedure converted to open procedure: Secondary | ICD-10-CM | POA: Diagnosis not present

## 2016-11-05 DIAGNOSIS — R102 Pelvic and perineal pain: Secondary | ICD-10-CM | POA: Diagnosis present

## 2016-11-05 DIAGNOSIS — N920 Excessive and frequent menstruation with regular cycle: Secondary | ICD-10-CM | POA: Diagnosis present

## 2016-11-05 HISTORY — PX: ABDOMINAL HYSTERECTOMY: SHX81

## 2016-11-05 HISTORY — PX: ROBOTIC ASSISTED TOTAL HYSTERECTOMY WITH SALPINGECTOMY: SHX6679

## 2016-11-05 HISTORY — PX: BILATERAL SALPINGECTOMY: SHX5743

## 2016-11-05 LAB — PREGNANCY, URINE: Preg Test, Ur: NEGATIVE

## 2016-11-05 SURGERY — ROBOTIC ASSISTED TOTAL HYSTERECTOMY WITH SALPINGECTOMY
Anesthesia: General | Site: Abdomen

## 2016-11-05 MED ORDER — FENTANYL CITRATE (PF) 100 MCG/2ML IJ SOLN
INTRAMUSCULAR | Status: DC | PRN
  Administered 2016-11-05 (×2): 50 ug via INTRAVENOUS
  Administered 2016-11-05: 100 ug via INTRAVENOUS
  Administered 2016-11-05 (×2): 50 ug via INTRAVENOUS

## 2016-11-05 MED ORDER — IBUPROFEN 600 MG PO TABS
600.0000 mg | ORAL_TABLET | Freq: Four times a day (QID) | ORAL | Status: DC | PRN
  Administered 2016-11-06 – 2016-11-07 (×3): 600 mg via ORAL
  Filled 2016-11-05 (×3): qty 1

## 2016-11-05 MED ORDER — DEXAMETHASONE SODIUM PHOSPHATE 4 MG/ML IJ SOLN
INTRAMUSCULAR | Status: AC
Start: 2016-11-05 — End: 2016-11-05
  Filled 2016-11-05: qty 1

## 2016-11-05 MED ORDER — ROPIVACAINE HCL 5 MG/ML IJ SOLN
INTRAMUSCULAR | Status: AC
  Filled 2016-11-05: qty 30

## 2016-11-05 MED ORDER — HYDROMORPHONE HCL 1 MG/ML IJ SOLN
0.2500 mg | INTRAMUSCULAR | Status: DC | PRN
  Administered 2016-11-05 (×3): 0.5 mg via INTRAVENOUS

## 2016-11-05 MED ORDER — LIDOCAINE HCL (CARDIAC) 20 MG/ML IV SOLN
INTRAVENOUS | Status: AC
  Filled 2016-11-05: qty 5

## 2016-11-05 MED ORDER — BUPIVACAINE HCL (PF) 0.25 % IJ SOLN
INTRAMUSCULAR | Status: DC | PRN
  Administered 2016-11-05: 12 mL

## 2016-11-05 MED ORDER — SCOPOLAMINE 1 MG/3DAYS TD PT72
MEDICATED_PATCH | TRANSDERMAL | Status: AC
  Administered 2016-11-05: 1.5 mg via TRANSDERMAL
  Filled 2016-11-05: qty 1

## 2016-11-05 MED ORDER — SCOPOLAMINE 1 MG/3DAYS TD PT72
1.0000 | MEDICATED_PATCH | Freq: Once | TRANSDERMAL | Status: DC
  Administered 2016-11-05: 1.5 mg via TRANSDERMAL

## 2016-11-05 MED ORDER — OXYCODONE-ACETAMINOPHEN 5-325 MG PO TABS
1.0000 | ORAL_TABLET | ORAL | Status: DC | PRN
  Administered 2016-11-06: 2 via ORAL
  Filled 2016-11-05: qty 2

## 2016-11-05 MED ORDER — PROPOFOL 10 MG/ML IV BOLUS
INTRAVENOUS | Status: AC
  Filled 2016-11-05: qty 20

## 2016-11-05 MED ORDER — SODIUM CHLORIDE 0.9 % IJ SOLN
INTRAMUSCULAR | Status: AC
  Filled 2016-11-05: qty 20

## 2016-11-05 MED ORDER — ARTIFICIAL TEARS OPHTHALMIC OINT
TOPICAL_OINTMENT | OPHTHALMIC | Status: AC
  Filled 2016-11-05: qty 7

## 2016-11-05 MED ORDER — ROCURONIUM BROMIDE 100 MG/10ML IV SOLN
INTRAVENOUS | Status: DC | PRN
  Administered 2016-11-05: 30 mg via INTRAVENOUS
  Administered 2016-11-05: 20 mg via INTRAVENOUS
  Administered 2016-11-05 (×2): 10 mg via INTRAVENOUS
  Administered 2016-11-05: 20 mg via INTRAVENOUS

## 2016-11-05 MED ORDER — BUPIVACAINE HCL (PF) 0.25 % IJ SOLN
INTRAMUSCULAR | Status: AC
  Filled 2016-11-05: qty 30

## 2016-11-05 MED ORDER — HYDROMORPHONE HCL 1 MG/ML IJ SOLN
INTRAMUSCULAR | Status: AC
  Administered 2016-11-05: 0.5 mg via INTRAVENOUS
  Filled 2016-11-05: qty 1

## 2016-11-05 MED ORDER — DEXAMETHASONE SODIUM PHOSPHATE 10 MG/ML IJ SOLN
INTRAMUSCULAR | Status: DC | PRN
  Administered 2016-11-05: 4 mg via INTRAVENOUS

## 2016-11-05 MED ORDER — KETOROLAC TROMETHAMINE 30 MG/ML IJ SOLN
INTRAMUSCULAR | Status: DC | PRN
  Administered 2016-11-05: 30 mg via INTRAVENOUS

## 2016-11-05 MED ORDER — LACTATED RINGERS IV SOLN
INTRAVENOUS | Status: DC
  Administered 2016-11-05 – 2016-11-06 (×4): via INTRAVENOUS

## 2016-11-05 MED ORDER — MIDAZOLAM HCL 2 MG/2ML IJ SOLN
INTRAMUSCULAR | Status: AC
  Filled 2016-11-05: qty 2

## 2016-11-05 MED ORDER — MIDAZOLAM HCL 2 MG/2ML IJ SOLN
INTRAMUSCULAR | Status: DC | PRN
  Administered 2016-11-05: 1 mg via INTRAVENOUS

## 2016-11-05 MED ORDER — SODIUM CHLORIDE 0.9 % IJ SOLN
INTRAMUSCULAR | Status: AC
  Filled 2016-11-05: qty 10

## 2016-11-05 MED ORDER — HYDROMORPHONE HCL 1 MG/ML IJ SOLN
1.0000 mg | INTRAMUSCULAR | Status: DC | PRN
  Administered 2016-11-05: 1 mg via INTRAVENOUS
  Filled 2016-11-05: qty 1

## 2016-11-05 MED ORDER — KETOROLAC TROMETHAMINE 30 MG/ML IJ SOLN
INTRAMUSCULAR | Status: AC
  Filled 2016-11-05: qty 1

## 2016-11-05 MED ORDER — LIDOCAINE HCL (CARDIAC) 20 MG/ML IV SOLN
INTRAVENOUS | Status: DC | PRN
  Administered 2016-11-05: 30 mg via INTRAVENOUS
  Administered 2016-11-05: 70 mg via INTRAVENOUS

## 2016-11-05 MED ORDER — CEFAZOLIN SODIUM-DEXTROSE 2-4 GM/100ML-% IV SOLN
2.0000 g | INTRAVENOUS | Status: AC
  Administered 2016-11-05: 2 g via INTRAVENOUS

## 2016-11-05 MED ORDER — SOD CITRATE-CITRIC ACID 500-334 MG/5ML PO SOLN
ORAL | Status: AC
  Administered 2016-11-05: 30 mL via ORAL
  Filled 2016-11-05: qty 15

## 2016-11-05 MED ORDER — HYDROMORPHONE HCL 1 MG/ML IJ SOLN
INTRAMUSCULAR | Status: AC
  Filled 2016-11-05: qty 0.5

## 2016-11-05 MED ORDER — ROCURONIUM BROMIDE 100 MG/10ML IV SOLN
INTRAVENOUS | Status: AC
  Filled 2016-11-05: qty 1

## 2016-11-05 MED ORDER — HYDROMORPHONE HCL 1 MG/ML IJ SOLN: 0.5000 mg | INTRAMUSCULAR | Status: DC | PRN

## 2016-11-05 MED ORDER — SOD CITRATE-CITRIC ACID 500-334 MG/5ML PO SOLN
30.0000 mL | ORAL | Status: AC
  Administered 2016-11-05: 30 mL via ORAL

## 2016-11-05 MED ORDER — SUGAMMADEX SODIUM 200 MG/2ML IV SOLN
INTRAVENOUS | Status: AC
  Filled 2016-11-05: qty 2

## 2016-11-05 MED ORDER — ONDANSETRON HCL 4 MG/2ML IJ SOLN
INTRAMUSCULAR | Status: AC
  Filled 2016-11-05: qty 2

## 2016-11-05 MED ORDER — ONDANSETRON HCL 4 MG/2ML IJ SOLN
INTRAMUSCULAR | Status: DC | PRN
  Administered 2016-11-05: 4 mg via INTRAVENOUS

## 2016-11-05 MED ORDER — SUGAMMADEX SODIUM 200 MG/2ML IV SOLN
INTRAVENOUS | Status: DC | PRN
  Administered 2016-11-05: 118.2 mg via INTRAVENOUS

## 2016-11-05 MED ORDER — FENTANYL CITRATE (PF) 250 MCG/5ML IJ SOLN
INTRAMUSCULAR | Status: AC
  Filled 2016-11-05: qty 5

## 2016-11-05 MED ORDER — LACTATED RINGERS IV SOLN
INTRAVENOUS | Status: DC
  Administered 2016-11-05 (×2): via INTRAVENOUS

## 2016-11-05 MED ORDER — FENTANYL CITRATE (PF) 100 MCG/2ML IJ SOLN
INTRAMUSCULAR | Status: AC
  Filled 2016-11-05: qty 2

## 2016-11-05 MED ORDER — PROPOFOL 10 MG/ML IV BOLUS
INTRAVENOUS | Status: DC | PRN
  Administered 2016-11-05: 170 mg via INTRAVENOUS

## 2016-11-05 SURGICAL SUPPLY — 59 items
ADH SKN CLS APL DERMABOND .7 (GAUZE/BANDAGES/DRESSINGS) ×3
BARRIER ADHS 3X4 INTERCEED (GAUZE/BANDAGES/DRESSINGS) IMPLANT
BLADE SURG 10 STRL SS (BLADE) ×2 IMPLANT
BRR ADH 4X3 ABS CNTRL BYND (GAUZE/BANDAGES/DRESSINGS)
CATH FOLEY 3WAY  5CC 16FR (CATHETERS) ×1
CATH FOLEY 3WAY 5CC 16FR (CATHETERS) ×3 IMPLANT
CLOTH BEACON ORANGE TIMEOUT ST (SAFETY) ×4 IMPLANT
CONT PATH 16OZ SNAP LID 3702 (MISCELLANEOUS) ×4 IMPLANT
COVER BACK TABLE 60X90IN (DRAPES) ×8 IMPLANT
COVER LIGHT HANDLE  1/PK (MISCELLANEOUS) ×2
COVER LIGHT HANDLE 1/PK (MISCELLANEOUS) IMPLANT
COVER TIP SHEARS 8 DVNC (MISCELLANEOUS) ×3 IMPLANT
COVER TIP SHEARS 8MM DA VINCI (MISCELLANEOUS) ×1
DECANTER SPIKE VIAL GLASS SM (MISCELLANEOUS) ×8 IMPLANT
DEFOGGER SCOPE WARMER CLEARIFY (MISCELLANEOUS) ×4 IMPLANT
DERMABOND ADVANCED (GAUZE/BANDAGES/DRESSINGS) ×1
DERMABOND ADVANCED .7 DNX12 (GAUZE/BANDAGES/DRESSINGS) ×3 IMPLANT
DURAPREP 26ML APPLICATOR (WOUND CARE) ×4 IMPLANT
ELECT REM PT RETURN 9FT ADLT (ELECTROSURGICAL) ×4
ELECTRODE REM PT RTRN 9FT ADLT (ELECTROSURGICAL) ×3 IMPLANT
GLOVE BIOGEL PI IND STRL 7.0 (GLOVE) ×15 IMPLANT
GLOVE BIOGEL PI INDICATOR 7.0 (GLOVE) ×5
KIT ACCESSORY DA VINCI DISP (KITS) ×1
KIT ACCESSORY DVNC DISP (KITS) ×3 IMPLANT
LEGGING LITHOTOMY PAIR STRL (DRAPES) ×5 IMPLANT
OCCLUDER COLPOPNEUMO (BALLOONS) ×4 IMPLANT
PACK ROBOT WH (CUSTOM PROCEDURE TRAY) ×4 IMPLANT
PACK ROBOTIC GOWN (GOWN DISPOSABLE) ×4 IMPLANT
PACK TRENDGUARD 450 HYBRID PRO (MISCELLANEOUS) IMPLANT
PAD PREP 24X48 CUFFED NSTRL (MISCELLANEOUS) ×8 IMPLANT
PROTECTOR NERVE ULNAR (MISCELLANEOUS) ×8 IMPLANT
SET CYSTO W/LG BORE CLAMP LF (SET/KITS/TRAYS/PACK) IMPLANT
SET IRRIG TUBING LAPAROSCOPIC (IRRIGATION / IRRIGATOR) ×4 IMPLANT
SET TRI-LUMEN FLTR TB AIRSEAL (TUBING) ×4 IMPLANT
SPONGE LAP 18X18 X RAY DECT (DISPOSABLE) ×2 IMPLANT
SUT ETHIBOND 0 (SUTURE) IMPLANT
SUT PLAIN 2 0 (SUTURE) ×4
SUT PLAIN ABS 2-0 CT1 27XMFL (SUTURE) IMPLANT
SUT VIC AB 0 CT1 18XCR BRD8 (SUTURE) IMPLANT
SUT VIC AB 0 CT1 27 (SUTURE) ×8
SUT VIC AB 0 CT1 27XBRD ANBCTR (SUTURE) IMPLANT
SUT VIC AB 0 CT1 36 (SUTURE) ×2 IMPLANT
SUT VIC AB 0 CT1 8-18 (SUTURE) ×8
SUT VIC AB 4-0 PS2 27 (SUTURE) ×9 IMPLANT
SUT VICRYL 0 TIES 12 18 (SUTURE) ×1 IMPLANT
SUT VICRYL 0 UR6 27IN ABS (SUTURE) ×6 IMPLANT
SUT VLOC 180 0 9IN  GS21 (SUTURE)
SUT VLOC 180 0 9IN GS21 (SUTURE) IMPLANT
SUT VLOC 180 2-0 6IN GS21 (SUTURE) IMPLANT
SYR 50ML LL SCALE MARK (SYRINGE) ×4 IMPLANT
SYSTEM CONVERTIBLE TROCAR (TROCAR) ×4 IMPLANT
TIP UTERINE 6.7X10CM GRN DISP (MISCELLANEOUS) ×1 IMPLANT
TOWEL OR 17X24 6PK STRL BLUE (TOWEL DISPOSABLE) ×12 IMPLANT
TRENDGUARD 450 HYBRID PRO PACK (MISCELLANEOUS) ×4
TROCAR 12M 150ML BLUNT (TROCAR) ×4 IMPLANT
TROCAR DISP BLADELESS 8 DVNC (TROCAR) ×3 IMPLANT
TROCAR DISP BLADELESS 8MM (TROCAR) ×1
TROCAR PORT AIRSEAL 5X120 (TROCAR) ×1 IMPLANT
TROCAR PORT AIRSEAL 8X120 (TROCAR) ×3 IMPLANT

## 2016-11-05 NOTE — Anesthesia Postprocedure Evaluation (Signed)
Anesthesia Post Note  Patient: Shannon Lawrence  Procedure(s) Performed: Procedure(s) (LRB): ATTEMPTED ROBOTIC ASSISTED TOTAL HYSTERECTOMY WITH BILATERAL SALPINGECTOMY (Bilateral) TOTAL ABDOMINAL HYSTERECTOMY (N/A) BILATERAL SALPINGECTOMY (Bilateral)     Anesthesia Type: General    Last Vitals:  Vitals:   11/05/16 1330 11/05/16 1415  BP: 125/65 121/61  Pulse: 64 74  Resp: 13 16  Temp:  36.6 C    Last Pain:  Vitals:   11/05/16 1415  TempSrc:   PainSc: (P) 4    Pain Goal: Patients Stated Pain Goal: 3 (11/05/16 0615)               Lyndle Herrlich EDWARD

## 2016-11-05 NOTE — Op Note (Signed)
Operative Note  11/05/2016  12:31 PM  PATIENT:  Shannon Lawrence  36 y.o. female  PRE-OPERATIVE DIAGNOSIS:  Large Fibroid with Pelvic pain and Menorrhagia  POST-OPERATIVE DIAGNOSIS:  Large Fibroid with Pelvic pain and Menorrhagia  PROCEDURE:  Procedure(s): ATTEMPTED ROBOTIC ASSISTED TOTAL HYSTERECTOMY WITH BILATERAL SALPINGECTOMY, CONVERTED TO TOTAL ABDOMINAL HYSTERECTOMY WITH BILATERAL SALPINGECTOMY  SURGEON:  Surgeon(s): [name], MD Fontaine, Belinda Block, MD  ANESTHESIA:   general  FINDINGS:  Large Intramural Myoma at lower uterine segment  DESCRIPTION OF OPERATION:   Under general anesthesia with endotracheal intubation the patient is in lithotomy position. She is prepped with ChloraPrep on the abdomen and with Betadine on the suprapubic, vulvar and vaginal areas. She is draped as usual. The Foley is inserted in the bladder. The vaginal exam reveals a large uterus about 12 cm mobile no adnexal mass. The weighted speculum is inserted in the vagina and the anterior lip of the cervix is grasped with a tenaculum. Dilation of the cervix with Hegar dilators, hysterometry at 10 cm and insertion of a #8 Rumi with a medium Koh ring.  The tenaculum and weighted speculum are removed. We go to the abdomen. The supraumbilical area is infiltrated with Marcaine one quarter plain. A 1.5 cm incision is made with the scalpel.  The aponeurosis is grasped with cokers.  It is opened with Mayo scissors.  The parietal peritoneum is opened bluntly with the finger. A pursestring stitch of Vicryl 0 is done on the aponeurosis. The Sheryle Hail is inserted at that level and up pneumoperitoneum is created with CO2.  The camera is inserted and the anterior wall of the abdomen is inspected.  A semicircular configuration is used for ports placement.  Infiltration of Marcaine one quarter plain at each site and small incisions with the scalpel.  2 robotic ports are inserted on the right side under direct vision and a  robotic port on the left lower side, with the 5 mm assistant port on the left upper side, also inserted under direct vision.  The patient is position in deep Trendelenburg. The robot his docked from the right side. Robotic instruments are inserted under direct vision with the Endo Shears scissor in the first arm. The PK in the second arm and the Prograsp clamp in the third arm.  We go to the console.      Inspection of the abdominopelvic cavities reveal a uterus with a large fibroid at the lower uterine segment just above the cervix.  The fibroid is about 10 cm and it is intramural and submucosal.  Both tubes are normal and both ovaries are normal in size and appearance.  No pathology is seen otherwise in the pelvis and the abdomen is normal with a normal liver and gallbladder.  Both ureters are seen and normal anatomy position with good peristalsis.  We start on the left side with cauterization and section of the left mesosalpinx. We then cauterized and sectioned the left utero-ovarian ligament. We cauterized and sectioned the left round ligament. We proceed the same way on the right side.  We were able to open the visceral peritoneum anteriorly and lowered the bladder past the fibroid.  The anatomy at the level of the uterine arteries is completely changed by the large low fibroid.  This is too large and too low to successfully lift it and gain access to the uterine arteries above the cervix.  The fibroid is pressing on both pelvic walls and does not allow any space.  We therefore make an attempt to do a myomectomy.  The uterine artery pedicle is cauterized high up on the myoma and the stretched myometrium is opened above that level anteriorly into the right.  The myoma is enucleated partially.  It is very vascular.  Hemostasis is well controlled as we go along but because of the risk of hemorrhage and the risk of trauma, we decide to interrupt our attempt to proceed robotically and rather to convert to a safer  opened abdominal approach.  The robotic instruments are therefore removed.  The robot is undocked.  The ports are removed.  The CO2 is evacuated.  The deep Trendelenburg is reversed.  The supra umbilical incision is closed by attaching the pursestring stitch. All incisions are closed with a Vicryl 4-0 in a subcuticular stitch. Dermabond was added on all incisions.   We prepare for a laparotomy.  Dr. Abbie Sons is called in to assist.      We make a Pfannenstiel incision.  The electrocautery is used on the adipose tissue. We opened the aponeurosis transversely with Mayo scissors. The recti muscles are separated from the aponeurosis at the midline.  The parietal peritoneum is opened with Metzenbaums scissors.  It is opened longitudinally.  The Balfour retractor is put in place with the bladder blade. 3 laps are inserted in the abdomen to retain the bowels up.  Kelly clamps are applied at the uterine cornua.  We complete the myomectomy.  We further descended the bladder past the cervix.  The left uterine artery is clamped with a curved in a and a straight. We section with the Mayo scissors. We suture with a Vicryl 0. We proceed the same way on the right side.  We then descended along the left side of the uterus with a straight Heaney, resection with the scalpel and we suture with a Heaney stitch of Vicryl 0.  We descended further on the right side with straight Heaney's section with the scalpel and suture with a Heaney stitch of Vicryl 0.  We reached the angle of the vagina on the left side we clamp with a curve Heaney, we section with Mayo scissors and suture with a Heaney stitch of Vicryl 0. We keep that stitch on a hemostat. We did the same thing on the right side. We complete the section of the vagina anteriorly and posteriorly in the middle.  The uterus is removed with the cervix and both tubes and sent to pathology together with the large myoma.  Pictures are taken of the specimens.  We closed the vaginal  vault with figure-of-eight's of Vicryl 0.  The uterosacral ligaments are already included with the vaginal angles.  Hemostasis is adequate at all levels.  We irrigated and suctioned the abdominopelvic cavities.  The 3 laps are removed. The Balfour retractor is removed.  The parietal peritoneum is closed with a running suture of Vicryl 2-0.  The aponeurosis is closed with 2 half running sutures of Vicryl 0. The aponeurosis is closed with a running suture of plain 2-0.  The skin is closed with a subcuticular suture of Vicryl 4-0. Dermabond was added. A honeycomb dressing is put in place with pressure dressing.  The patient is brought to recovery room in good and stable status.          ESTIMATED BLOOD LOSS: 300 cc   Intake/Output Summary (Last 24 hours) at 11/05/16 1231 Last data filed at 11/05/16 1105  Gross per 24 hour  Intake  1500 ml  Output              450 ml  Net             1050 ml     BLOOD ADMINISTERED:none   LOCAL MEDICATIONS USED:  MARCAINE     SPECIMEN:  Source of Specimen:  Uterus (Fibroid) with Cervix and both Tubes  DISPOSITION OF SPECIMEN:  PATHOLOGY  COUNTS:  YES  PLAN OF CARE: Transfer to PACU  Marie-Lyne LavoieMD12:31 PM

## 2016-11-05 NOTE — OR Nursing (Signed)
Decision made by Dr. Dellis Filbert to convert to abdominal surgery at 0935. Family notified. Fan and steel incision made at 1001.

## 2016-11-05 NOTE — Transfer of Care (Signed)
Immediate Anesthesia Transfer of Care Note  Patient: Shannon Lawrence  Procedure(s) Performed: Procedure(s): ATTEMPTED ROBOTIC ASSISTED TOTAL HYSTERECTOMY WITH BILATERAL SALPINGECTOMY (Bilateral) TOTAL ABDOMINAL HYSTERECTOMY (N/A) BILATERAL SALPINGECTOMY (Bilateral)  Patient Location: PACU  Anesthesia Type:General  Level of Consciousness: awake, sedated and patient cooperative  Airway & Oxygen Therapy: Patient Spontanous Breathing and Patient connected to nasal cannula oxygen  Post-op Assessment: Report given to RN and Post -op Vital signs reviewed and stable  Post vital signs: Reviewed and stable  Last Vitals:  Vitals:   11/05/16 0615  BP: 118/79  Pulse: 76  Resp: 14  Temp: 36.4 C    Last Pain:  Vitals:   11/05/16 0615  TempSrc: Oral      Patients Stated Pain Goal: 3 (15/61/53 7943)  Complications: No apparent anesthesia complications

## 2016-11-05 NOTE — Anesthesia Procedure Notes (Signed)
Procedure Name: Intubation Date/Time: 11/05/2016 7:30 AM Performed by: Tobin Chad Pre-anesthesia Checklist: Patient identified, Emergency Drugs available, Suction available and Patient being monitored Patient Re-evaluated:Patient Re-evaluated prior to induction Oxygen Delivery Method: Circle system utilized and Simple face mask Preoxygenation: Pre-oxygenation with 100% oxygen Induction Type: IV induction Ventilation: Mask ventilation without difficulty Laryngoscope Size: Mac and 3 Grade View: Grade I Tube size: 7.0 mm Number of attempts: 1 Airway Equipment and Method: Stylet Placement Confirmation: ETT inserted through vocal cords under direct vision,  positive ETCO2 and breath sounds checked- equal and bilateral Secured at: 20 (com) cm Tube secured with: Tape Dental Injury: Teeth and Oropharynx as per pre-operative assessment

## 2016-11-05 NOTE — H&P (Signed)
Shannon Lawrence is an 36 y.o. female G56P2A2 Married  RP:  Large symptomatic Myomas with Menorrhagia and Pelvic pain for Robotic TLH/Bilateral Salpingectomy  HPI:  No recent change.  Pertinent Gynecological History: Menses: Heavy Contraception: condoms Blood transfusions: none Sexually transmitted diseases: no past history Previous GYN Procedures:  None Last mammogram: None  Last pap: Normal   Menstrual History:  Patient's last menstrual period was 10/15/2016 (approximate).    Past Medical History:  Diagnosis Date  . Cyst (solitary) of breast left breast   patient denies this is a part of her medical history 10/28/16  . Diabetes mellitus    gestational    Past Surgical History:  Procedure Laterality Date  . EYE SURGERY     2000 left eye    Family History  Problem Relation Age of Onset  . Brain cancer Maternal Grandmother   . Cancer Other   . COPD Other   . Hyperlipidemia Other   . Hypertension Mother   . Cancer Maternal Grandfather        stomach  . Stroke Paternal Grandmother   . Diabetes Paternal Grandfather     Social History:  reports that she has never smoked. She has never used smokeless tobacco. She reports that she drinks alcohol. She reports that she does not use drugs.  Allergies:  Allergies  Allergen Reactions  . Sulfa Drugs Cross Reactors Other (See Comments)    Reaction unknown    No prescriptions prior to admission.    ROS Neg  Blood pressure 118/79, pulse 76, temperature 97.6 F (36.4 C), temperature source Oral, resp. rate 14, last menstrual period 10/15/2016, SpO2 100 %, unknown if currently breastfeeding. Physical Exam  Results for orders placed or performed during the hospital encounter of 11/05/16 (from the past 24 hour(s))  Pregnancy, urine     Status: None   Collection Time: 11/05/16  6:00 AM  Result Value Ref Range   Preg Test, Ur NEGATIVE NEGATIVE    Pelvic US 08/2016: T/V and T/A:  Anteverted uterus with large IM Myoma  9.5 x 8.4 x 9.8 cm with uterine and Endometrial displacement.  Endometrial line thickened at 15.1 mm.  Rt and Lt Ovaries normal.  No FF in CDS.    Assessment/Plan:  1. Intramural leiomyoma of uterus Large symptomatic uterine myoma.  Declines medical approaches.  No desire to preserve fertility.  Decision to proceed with Robotic TLH/Bilateral Salpingectomy.  Preservation of Ovaries.  Surgery and risks reviewed as below.  2. Menorrhagia with regular cycle Persistent heavy menses associated with large IM Myoma.  3. Female pelvic pain Rt pelvic pain probably secondary to large uterine myoma towards Rt side.                        Patient was counseled as to the risk of surgery to include the following:  1. Infection (prohylactic antibiotics will be administered)  2. DVT/Pulmonary Embolism (prophylactic pneumo compression stockings will be used)  3.Trauma to internal organs requiring additional surgical procedure to repair any injury to     Internal organs requiring perhaps additional hospitalization days.  4.Hemmorhage requiring transfusion and blood products which carry risks such as anaphylactic reaction, hepatitis and AIDS  Patient had received literature information on the procedure scheduled and all her questions were answered and fully accepts all risk.   Marie-Lyne Ada [place]bury 11/05/2016, 7:15 AM

## 2016-11-06 ENCOUNTER — Encounter (HOSPITAL_COMMUNITY): Payer: Self-pay | Admitting: Obstetrics & Gynecology

## 2016-11-06 LAB — CBC
HCT: 28.1 % — ABNORMAL LOW (ref 36.0–46.0)
Hemoglobin: 9.7 g/dL — ABNORMAL LOW (ref 12.0–15.0)
MCH: 31 pg (ref 26.0–34.0)
MCHC: 34.5 g/dL (ref 30.0–36.0)
MCV: 89.8 fL (ref 78.0–100.0)
PLATELETS: 248 10*3/uL (ref 150–400)
RBC: 3.13 MIL/uL — AB (ref 3.87–5.11)
RDW: 13.7 % (ref 11.5–15.5)
WBC: 9.8 10*3/uL (ref 4.0–10.5)

## 2016-11-07 MED ORDER — OXYCODONE-ACETAMINOPHEN 7.5-325 MG PO TABS: 1.0000 | ORAL_TABLET | ORAL | 0 refills | Status: DC | PRN

## 2016-11-07 NOTE — Progress Notes (Signed)
POD#1 TAH/Bilateral Salpingectomy  Subjective: Patient reports tolerating PO and no problems voiding.  Not feeling passage of gas yet.  Ambulating well.  Pain controled with Percocet.  Objective: I have reviewed patient's vital signs.  vital signs, medications and labs.  Vitals:   11/06/16 2335 11/06/16 2359  BP: (!) 101/41 111/61  Pulse: 77 76  Resp: 17   Temp: 97.6 F (36.4 C)    I/O last 3 completed shifts: In: 2006.3 [I.V.:2006.3] Out: 1776 [BLTJQ:3009; Blood:300] No intake/output data recorded.  Results for orders placed or performed during the hospital encounter of 11/05/16 (from the past 24 hour(s))  CBC     Status: Abnormal   Collection Time: 11/06/16  5:18 AM  Result Value Ref Range   WBC 9.8 4.0 - 10.5 K/uL   RBC 3.13 (L) 3.87 - 5.11 MIL/uL   Hemoglobin 9.7 (L) 12.0 - 15.0 g/dL   HCT 28.1 (L) 36.0 - 46.0 %   MCV 89.8 78.0 - 100.0 fL   MCH 31.0 26.0 - 34.0 pg   MCHC 34.5 30.0 - 36.0 g/dL   RDW 13.7 11.5 - 15.5 %   Platelets 248 150 - 400 K/uL    EXAM General: alert and no distress Resp: clear to auscultation bilaterally Cardio: regular rate and rhythm GI: soft, non-tender; bowel sounds normal; no masses,  no organomegaly Extremities: no edema, redness or tenderness in the calves or thighs Vaginal Bleeding: none  Assessment: s/p Procedure(s): ATTEMPTED ROBOTIC ASSISTED TOTAL HYSTERECTOMY WITH BILATERAL SALPINGECTOMY TOTAL ABDOMINAL HYSTERECTOMY BILATERAL SALPINGECTOMY: stable, progressing well and tolerating diet  Plan: Encourage ambulation Probable d/c home tomorrow  LOS: 2 days    [name], MD 11/07/2016 12:39 AM    11/07/2016, 12:39 AM

## 2016-11-07 NOTE — Progress Notes (Signed)
POD#2  Subjective: Patient reports + flatus.    Objective: I have reviewed patient's vital signs.  vital signs, medications, labs and pathology.  Vitals:   11/06/16 2359 11/07/16 0349  BP: 111/61 (!) 112/57  Pulse: 76 69  Resp:  18  Temp:  98.9 F (37.2 C)   I/O last 3 completed shifts: In: -  Out: 1101 [Urine:1101] No intake/output data recorded.  No results found for this or any previous visit (from the past 24 hour(s)).  EXAM General: alert and cooperative Resp: clear to auscultation bilaterally Cardio: regular rate and rhythm GI: soft, non-tender; bowel sounds normal; no masses,  no organomegaly Extremities: Normal, no oedema, NT Vaginal Bleeding: none  Patho:  Benign.  Uterus with Fibroid/tubes Assessment: s/p Procedure(s): ATTEMPTED ROBOTIC ASSISTED TOTAL HYSTERECTOMY WITH BILATERAL SALPINGECTOMY TOTAL ABDOMINAL HYSTERECTOMY BILATERAL SALPINGECTOMY: progressing well  Plan: Discharge home  LOS: 2 days    [name], MD 11/07/2016 8:03 AM    11/07/2016, 8:03 AM

## 2016-11-07 NOTE — Discharge Instructions (Signed)
Abdominal Hysterectomy, Care After °This sheet gives you information about how to care for yourself after your procedure. Your health care provider may also give you more specific instructions. If you have problems or questions, contact your health care provider. °What can I expect after the procedure? °After your procedure, it is common to have: °· Pain. °· Fatigue. °· Poor appetite. °· Less interest in sex. °· Vaginal bleeding and discharge. You may need to use a sanitary napkin after this procedure. ° °Follow these instructions at home: °Bathing °· Do not take baths, swim, or use a hot tub until your health care provider approves. Ask your health care provider if you can take showers. You may only be allowed to take sponge baths for bathing. °· Keep the bandage (dressing) dry until your health care provider says it can be removed. °Incision care °· Follow instructions from your health care provider about how to take care of your incision. Make sure you: °? Wash your hands with soap and water before you change your bandage (dressing). If soap and water are not available, use hand sanitizer. °? Change your dressing as told by your health care provider. °? Leave stitches (sutures), skin glue, or adhesive strips in place. These skin closures may need to stay in place for 2 weeks or longer. If adhesive strip edges start to loosen and curl up, you may trim the loose edges. Do not remove adhesive strips completely unless your health care provider tells you to do that. °· Check your incision area every day for signs of infection. Check for: °? Redness, swelling, or pain. °? Fluid or blood. °? Warmth. °? Pus or a bad smell. °Activity °· Do gentle, daily exercises as told by your health care provider. You may be told to take short walks every day and go farther each time. °· Do not lift anything that is heavier than 10 lb (4.5 kg), or the limit that your health care provider tells you, until he or she says that it is  safe. °· Do not drive or use heavy machinery while taking prescription pain medicine. °· Do not drive for 24 hours if you were given a medicine to help you relax (sedative). °· Follow your health care provider's instructions about exercise, driving, and general activities. Ask your health care provider what activities are safe for you. °Lifestyle °· Do not douche, use tampons, or have sex for at least 6 weeks or as told by your health care provider. °· Do not drink alcohol until your health care provider approves. °· Drink enough fluid to keep your urine clear or pale yellow. °· Try to have someone at home with you for the first 1-2 weeks to help. °· Do not use any products that contain nicotine or tobacco, such as cigarettes and e-cigarettes. These can delay healing. If you need help quitting, ask your health care provider. °General instructions °· Take over-the-counter and prescription medicines only as told by your health care provider. °· Do not take aspirin or ibuprofen. These medicines can cause bleeding. °· To prevent or treat constipation while you are taking prescription pain medicine, your health care provider may recommend that you: °? Drink enough fluid to keep your urine clear or pale yellow. °? Take over-the-counter or prescription medicines. °? Eat foods that are high in fiber, such as fresh fruits and vegetables, whole grains, and beans. °? Limit foods that are high in fat and processed sugars, such as fried and sweet foods. °· Keep all   follow-up visits as told by your health care provider. This is important. °Contact a health care provider if: °· You have chills or fever. °· You have redness, swelling, or pain around your incision. °· You have fluid or blood coming from your incision. °· Your incision feels warm to the touch. °· You have pus or a bad smell coming from your incision. °· Your incision breaks open. °· You feel dizzy or light-headed. °· You have pain or bleeding when you urinate. °· You  have persistent diarrhea. °· You have persistent nausea and vomiting. °· You have abnormal vaginal discharge. °· You have a rash. °· You have any type of abnormal reaction or you develop an allergy to your medicine. °· Your pain medicine does not help. °Get help right away if: °· You have a fever and your symptoms suddenly get worse. °· You have severe abdominal pain. °· You have shortness of breath. °· You faint. °· You have pain, swelling, or redness in your leg. °· You have heavy vaginal bleeding with blood clots. °Summary °· After your procedure, it is common to have pain, fatigue and vaginal discharge. °· Do not take baths, swim, or use a hot tub until your health care provider approves. Ask your health care provider if you can take showers. You may only be allowed to take sponge baths for bathing. °· Follow your health care provider's instructions about exercise, driving, and general activities. Ask your health care provider what activities are safe for you. °· Do not lift anything that is heavier than 10 lb (4.5 kg), or the limit that your health care provider tells you, until he or she says that it is safe. °· Try to have someone at home with you for the first 1-2 weeks to help. °This information is not intended to replace advice given to you by your health care provider. Make sure you discuss any questions you have with your health care provider. °Document Released: 10/12/2004 Document Revised: 03/13/2016 Document Reviewed: 03/13/2016 °Elsevier Interactive Patient Education © 2017 [employer] ° °

## 2016-11-07 NOTE — Progress Notes (Signed)
Pt. Is discharged in the care of husband with N.T. Escort. Denies any pain or discomfort. Spirits are good. Understands all instructions well. Questions asked and answered. Abdominal incisions are clean and dry. Stable. No excessive.vaginal bleeding.

## 2016-11-07 NOTE — Discharge Summary (Signed)
Physician Discharge Summary  Patient ID: DESYRE CALMA MRN: [id number] DOB/AGE: 38-Mar-1982 38 y.o.  Admit date: 11/05/2016 Discharge date: 11/07/2016  Admission Diagnoses: fibroids, menorrhagia   Discharge Diagnoses: same Active Problems:   Postoperative state   Discharged Condition: good  Consults:None  Significant Diagnostic Studies: None  Treatments:surgery: Attempted Robotic approach, Total Abdominal Hysterectomy with Bilateral Salpingectomy  Vitals:   11/06/16 2359 11/07/16 0349  BP: 111/61 (!) 112/57  Pulse: 76 69  Resp:  18  Temp:  98.9 F (37.2 C)     No intake/output data recorded.   Hospital Course: Good   Discharge Exam: Normal postop exam   Disposition: 01-Home or Self Care    Allergies as of 11/07/2016      Reactions   Sulfa Drugs Cross Reactors Other (See Comments)   Reaction unknown      Medication List    TAKE these medications   oxyCODONE-acetaminophen 7.5-325 MG tablet Commonly known as:  PERCOCET Take 1 tablet by mouth every 4 (four) hours as needed for severe pain.        Follow-up Information    [name], MD Follow up in 3 week(s).   Specialty:  Obstetrics and Gynecology Contact information: [place] [address] [phone]            Signed: [name] 11/07/2016, 8:00 AM  Note: This dictation was prepared with  Dragon/digital dictation along withSmart phrase technology. Any transcriptional errors that result from this process are unintentional.

## 2016-11-27 ENCOUNTER — Ambulatory Visit (INDEPENDENT_AMBULATORY_CARE_PROVIDER_SITE_OTHER): Payer: 59 | Admitting: Obstetrics & Gynecology

## 2016-11-27 ENCOUNTER — Encounter: Payer: Self-pay | Admitting: Obstetrics & Gynecology

## 2016-11-27 VITALS — BP 128/80

## 2016-11-27 DIAGNOSIS — Z09 Encounter for follow-up examination after completed treatment for conditions other than malignant neoplasm: Secondary | ICD-10-CM

## 2016-11-27 NOTE — Patient Instructions (Signed)
1. Follow-up examination after gynecological surgery Good postop evolution.  Recommendations reviewed.  No moderate to intense physical activity requiring abdominal wall until 6 weeks postop.  No intercourse until 8 weeks postop.  F/U in 4 weeks.

## 2016-11-27 NOTE — Progress Notes (Signed)
    PERNELLA ACKERLEY Dec 30, 1980 [id number]        36 y.o.  [id number]   RP:  Postop TAH/Bilateral Salpingectomy 11/05/2016  HPI:  Doing very well.  No abdominopelvic pain except mild pressure when standing for a while, helped by Ibuprofen.  No vaginal bleeding.  No fever.  Mictions normal.  BMs normal.  Past medical history,surgical history, problem list, medications, allergies, family history and social history were all reviewed and documented in the EPIC chart.  Directed ROS with pertinent positives and negatives documented in the history of present illness/assessment and plan.  Exam:  Vitals:   11/27/16 1524  BP: 128/80   General appearance:  Normal  Abdo:  Incisions still covered with dressing.  Dressing removed.  Incisions well healed.              Soft, NT, no distension.  Gyn exam:  Vulva normal                    Speculum:  Vaginal vault healing well, no dehiscence, no bleeding.  Patho:  Benign, myomas  Assessment/Plan:  36 y.o. [id number]   1. Follow-up examination after gynecological surgery Good postop evolution.  Recommendations reviewed.  No moderate to intense physical activity requiring abdominal wall until 6 weeks postop.  No intercourse until 8 weeks postop.  F/U in 4 weeks.  [name] MD, 3:36 PM 11/27/2016

## 2016-12-25 ENCOUNTER — Encounter: Payer: Self-pay | Admitting: Obstetrics & Gynecology

## 2016-12-25 ENCOUNTER — Ambulatory Visit (INDEPENDENT_AMBULATORY_CARE_PROVIDER_SITE_OTHER): Payer: 59 | Admitting: Obstetrics & Gynecology

## 2016-12-25 VITALS — BP 128/74

## 2016-12-25 DIAGNOSIS — Z09 Encounter for follow-up examination after completed treatment for conditions other than malignant neoplasm: Secondary | ICD-10-CM

## 2016-12-25 NOTE — Progress Notes (Signed)
    Shannon Lawrence Jul 29, 1980 [id number]        36 y.o.  [id number]   RP:  TAH/Bilateral Salpingectomy 11/05/2016  HPI:  Doing very well.  No abdominopelvic pain.  No vaginal bleeding or abnormal d/c.  No fever.  Past medical history,surgical history, problem list, medications, allergies, family history and social history were all reviewed and documented in the EPIC chart.  Directed ROS with pertinent positives and negatives documented in the history of present illness/assessment and plan.  Exam:  Vitals:   12/25/16 1129  BP: 128/74   General appearance:  Normal  Abdomen:  Soft, NT.  Incision well healed.  Gyn exam:  Vulva normal.  Speculum Vaginal vault intact.  Bimanual exam:  No pelvic mass, NT.  Assessment/Plan:  36 y.o. [id number]   1. Follow-up examination after gynecological surgery Very good postop healing, no Cx.  Can resume all physical activities and sexual activity.  F/U Annual/Gyn exam.  [name] MD, 11:48 AM 12/25/2016

## 2016-12-29 NOTE — Patient Instructions (Signed)
1. Follow-up examination after gynecological surgery Very good postop healing, no Cx.  Can resume all physical activities and sexual activity.  F/U Annual/Gyn exam.

## 2017-07-16 ENCOUNTER — Ambulatory Visit: Payer: 59 | Admitting: Family Medicine

## 2017-07-16 ENCOUNTER — Encounter: Payer: Self-pay | Admitting: Family Medicine

## 2017-07-16 VITALS — BP 120/70 | HR 80 | Temp 98.5°F | Ht 64.0 in | Wt 137.0 lb

## 2017-07-16 DIAGNOSIS — L920 Granuloma annulare: Secondary | ICD-10-CM | POA: Diagnosis not present

## 2017-07-16 DIAGNOSIS — L989 Disorder of the skin and subcutaneous tissue, unspecified: Secondary | ICD-10-CM

## 2017-07-16 NOTE — Progress Notes (Signed)
Shannon Lawrence is a 37 y.o. female here for an acute visit.  History of Present Illness:   I acted as a Education administrator for Dr. Harriette Ohara, LPN  Rash  This is a new problem. Episode onset: Started in Feburary. The problem has been gradually worsening since onset. The affected locations include the chest, back and right shoulder. The rash is characterized by blistering and redness. It is unknown if there was an exposure to a precipitant. Treatments tried: clobetasol cream x 2. The treatment provided no relief. There is no history of allergies, asthma or eczema.   PMHx, SurgHx, SocialHx, Medications, and Allergies were reviewed in the Visit Navigator and updated as appropriate.  Current Medications:   Current Outpatient Medications:  .  ibuprofen (ADVIL,MOTRIN) 200 MG tablet, Take 200 mg by mouth every 6 (six) hours as needed (AS NEEDED FOR PAIN)., Disp: , Rfl:    Allergies  Allergen Reactions  . Sulfa Drugs Cross Reactors Other (See Comments)    Reaction unknown   Review of Systems:   Pertinent items are noted in the HPI. Otherwise, ROS is negative.  Vitals:   Vitals:   07/16/17 0844  BP: 120/70  Pulse: 80  Temp: 98.5 F (36.9 C)  TempSrc: Oral  SpO2: 99%  Weight: 137 lb (62.1 kg)  Height: 5\' 4"  (1.626 m)     Body mass index is 23.52 kg/m.  Physical Exam:   Physical Exam  Constitutional: She appears well-nourished.  HENT:  Head: Normocephalic and atraumatic.  Eyes: Pupils are equal, round, and reactive to light. EOM are normal.  Neck: Normal range of motion. Neck supple.  Cardiovascular: Normal rate, regular rhythm, normal heart sounds and intact distal pulses.  Pulmonary/Chest: Effort normal.  Abdominal: Soft.  Skin: Skin is warm.     Psychiatric: She has a normal mood and affect. Her behavior is normal.  Nursing note and vitals reviewed.   Results for orders placed or performed during the hospital encounter of 11/05/16  Pregnancy, urine    Result Value Ref Range   Preg Test, Ur NEGATIVE NEGATIVE  CBC  Result Value Ref Range   WBC 9.8 4.0 - 10.5 K/uL   RBC 3.13 (L) 3.87 - 5.11 MIL/uL   Hemoglobin 9.7 (L) 12.0 - 15.0 g/dL   HCT 28.1 (L) 36.0 - 46.0 %   MCV 89.8 78.0 - 100.0 fL   MCH 31.0 26.0 - 34.0 pg   MCHC 34.5 30.0 - 36.0 g/dL   RDW 13.7 11.5 - 15.5 %   Platelets 248 150 - 400 K/uL   Assessment and Plan:   1. Skin lesion Procedure Note:   Procedure: Skin biopsy Indication:  Suspicious lesion  Risks including unsuccessful procedure, bleeding, infection, bruising, scar, a need for another complete procedure and others were explained to the patient in detail as well as the benefits. Informed consent was obtained and signed.   The patient was placed in a decubitus position.  Lesion #1 on the right upper back measuring  4 mm  Skin over lesion #1  was prepped with Betadine and alcohol  and anesthetized with 1 cc of 2% lidocaine and epinephrine, using a 25-gauge 1 inch needle.  Shave biopsy with a sterile Dermablade was carried out in the usual fashion. Hemostasis with drysol. Band-Aid was applied with antibiotic ointment.  - Dermatology pathology  . Reviewed expectations re: course of current medical issues. . Discussed self-management of symptoms. . Outlined signs and symptoms indicating need for  more acute intervention. . Patient verbalized understanding and all questions were answered. Marland Kitchen Health Maintenance issues including appropriate healthy diet, exercise, and smoking avoidance were discussed with patient. . See orders for this visit as documented in the electronic medical record. . Patient received an After Visit Summary.  CMA served as Education administrator during this visit. History, Physical, and Plan performed by medical provider. The above documentation has been reviewed and is accurate and complete. Briscoe Deutscher, D.O.   Briscoe Deutscher, DO [place] 07/16/2017

## 2017-07-16 NOTE — Progress Notes (Deleted)
   Shannon Lawrence is a 37 y.o. female is here for follow up.  History of Present Illness:   HPI: Health Maintenance Due  Topic Date Due  . TETANUS/TDAP  12/22/1997   No flowsheet data found. PMHx, SurgHx, SocialHx, FamHx, Medications, and Allergies were reviewed in the Visit Navigator and updated as appropriate.   Patient Active Problem List   Diagnosis Date Noted  . Postoperative state 11/05/2016  . Tinea corporis 05/09/2016   Social History   Tobacco Use  . Smoking status: Never Smoker  . Smokeless tobacco: Never Used  Substance Use Topics  . Alcohol use: Yes    Comment: social  . Drug use: No   Current Medications and Allergies:   Current Outpatient Medications:  .  ibuprofen (ADVIL,MOTRIN) 200 MG tablet, Take 200 mg by mouth every 6 (six) hours as needed (AS NEEDED FOR PAIN)., Disp: , Rfl:   Allergies  Allergen Reactions  . Sulfa Drugs Cross Reactors Other (See Comments)    Reaction unknown   Review of Systems   Pertinent items are noted in the HPI. Otherwise, ROS is negative.  Vitals:  There were no vitals filed for this visit.   There is no height or weight on file to calculate BMI. Physical Exam:   Physical Exam Results for orders placed or performed during the hospital encounter of 11/05/16  Pregnancy, urine  Result Value Ref Range   Preg Test, Ur NEGATIVE NEGATIVE  CBC  Result Value Ref Range   WBC 9.8 4.0 - 10.5 K/uL   RBC 3.13 (L) 3.87 - 5.11 MIL/uL   Hemoglobin 9.7 (L) 12.0 - 15.0 g/dL   HCT 28.1 (L) 36.0 - 46.0 %   MCV 89.8 78.0 - 100.0 fL   MCH 31.0 26.0 - 34.0 pg   MCHC 34.5 30.0 - 36.0 g/dL   RDW 13.7 11.5 - 15.5 %   Platelets 248 150 - 400 K/uL    Assessment and Plan:   There are no diagnoses linked to this encounter.  . Reviewed expectations re: course of current medical issues. . Discussed self-management of symptoms. . Outlined signs and symptoms indicating need for more acute intervention. . Patient verbalized  understanding and all questions were answered. Marland Kitchen Health Maintenance issues including appropriate healthy diet, exercise, and smoking avoidance were discussed with patient. . See orders for this visit as documented in the electronic medical record. . Patient received an After Visit Summary.  Briscoe Deutscher, DO [place] 07/16/2017  Future Appointments  Date Time Provider [place]  07/16/2017  8:40 AM Briscoe Deutscher, DO [facility]  08/13/2017  3:30 PM [name], MD [facility]

## 2017-07-21 ENCOUNTER — Telehealth: Payer: Self-pay | Admitting: Family Medicine

## 2017-07-21 NOTE — Telephone Encounter (Signed)
Copied from [place] [id number]. Topic: Quick Communication - Lab Results >> Jul 21, 2017  9:14 AM Francella Solian, CMA wrote: Called patient to inform them of 07/21/2017 lab results. When patient returns call, triage nurse may disclose results. >> Jul 21, 2017  9:55 AM Hewitt Shorts wrote: Pt returned call for lab results but [place] was buys   Best number [phone]

## 2017-07-21 NOTE — Telephone Encounter (Signed)
Documented in result notes. 

## 2017-07-22 ENCOUNTER — Telehealth: Payer: Self-pay | Admitting: Family Medicine

## 2017-07-22 NOTE — Telephone Encounter (Signed)
Per lab notes Dr. Juleen China wanted to set patient up with dermatology and this it the one she would like to see. I have faxed results to their office. Patient informed.

## 2017-07-22 NOTE — Telephone Encounter (Signed)
See note.   Copied from [place] [phone]. Topic: Quick Communication - See Telephone Encounter >> Jul 22, 2017 11:16 AM Boyd Kerbs wrote: CRM for notification.   Pt. Needs ROI form faxed # [phone]  She is wanting to have Biopsy sent to Dr. Lorelee New, [place].   See Telephone encounter for: 07/22/17.

## 2017-07-28 ENCOUNTER — Encounter: Payer: Commercial Managed Care - HMO | Admitting: Obstetrics & Gynecology

## 2017-08-08 DIAGNOSIS — L920 Granuloma annulare: Secondary | ICD-10-CM | POA: Diagnosis not present

## 2017-08-13 ENCOUNTER — Ambulatory Visit: Payer: 59 | Admitting: Obstetrics & Gynecology

## 2017-08-13 ENCOUNTER — Encounter: Payer: Self-pay | Admitting: Obstetrics & Gynecology

## 2017-08-13 VITALS — BP 128/86 | Ht 64.0 in | Wt 139.0 lb

## 2017-08-13 DIAGNOSIS — L920 Granuloma annulare: Secondary | ICD-10-CM | POA: Diagnosis not present

## 2017-08-13 DIAGNOSIS — Z90710 Acquired absence of both cervix and uterus: Secondary | ICD-10-CM | POA: Diagnosis not present

## 2017-08-13 DIAGNOSIS — Z01419 Encounter for gynecological examination (general) (routine) without abnormal findings: Secondary | ICD-10-CM

## 2017-08-13 NOTE — Patient Instructions (Signed)
  1. Well female exam with routine gynecological exam Normal gynecologic exam status post total abdominal hysterectomy.  Pap test negative in the past and pathology of the cervix and uterus benign July 2018.  Breast exam normal.  Will do TSH and hemoglobin A1c today and follow-up for fasting health labs. - TSH - Hemoglobin A1C - Comp Met (CMET); Future - CBC; Future - Lipid panel; Future - VITAMIN D 25 Hydroxy (Vit-D Deficiency, Fractures); Future  2. 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  Rule out thyroid dysfunction and diabetes. - TSH - Hemoglobin A1C  Anderson Malta, it was a pleasure seeing you today!  I will inform you of your results as soon as they are available.

## 2017-08-13 NOTE — Progress Notes (Signed)
DENIM START May 28, 1980 [id number]   History:    37 y.o. G4P2A2L2 Married.  Sons are 5 and 59 yo  RP:  Established patient presenting for annual gyn exam   HPI: S/P TAH/Bilateral Salpingectomy for Fibroids 10/2016.  No pelvic pain.  No pain with intercourse.  Normal vaginal secretions.  Urine and bowel movements normal.  Breasts normal.  Body mass index 23.86.  Physically active.  Would like to do health labs here.  Seen by dermatology for granuloma annulare.  Past medical history,surgical history, family history and social history were all reviewed and documented in the EPIC chart.  Gynecologic History Patient's last menstrual period was 10/15/2016 (approximate). Contraception: status post hysterectomy Last Pap: 07/2016. Results were: Negative.  Patho of cervix/uterus benign 10/2016 Last mammogram: Never Bone Density: Never Colonoscopy: Never  Obstetric History OB History  Gravida Para Term Preterm AB Living  _0 SAB TAB Ectopic Multiple Live Births  2       1    # Outcome Date GA Lbr Len/2nd Weight Sex Delivery Anes PTL Lv  4 Term 12/19/11 [id number] / 00:10 6 lb 10.9 oz (3.03 kg) M Vag-Spont None  LIV  3 SAB 09/18/10          2 SAB 04/19/10          1 Term 04/20/07             ROS: A ROS was performed and pertinent positives and negatives are included in the history.  GENERAL: No fevers or chills. HEENT: No change in vision, no earache, sore throat or sinus congestion. NECK: No pain or stiffness. CARDIOVASCULAR: No chest pain or pressure. No palpitations. PULMONARY: No shortness of breath, cough or wheeze. GASTROINTESTINAL: No abdominal pain, nausea, vomiting or diarrhea, melena or bright red blood per rectum. GENITOURINARY: No urinary frequency, urgency, hesitancy or dysuria. MUSCULOSKELETAL: No joint or muscle pain, no back pain, no recent trauma. DERMATOLOGIC: No rash, no itching, no lesions. ENDOCRINE: No polyuria, polydipsia, no heat or cold intolerance. No  recent change in weight. HEMATOLOGICAL: No anemia or easy bruising or bleeding. NEUROLOGIC: No headache, seizures, numbness, tingling or weakness. PSYCHIATRIC: No depression, no loss of interest in normal activity or change in sleep pattern.     Exam:   BP 128/86   Ht _1  (1.626 m)   Wt 139 lb (63 kg)   LMP 10/15/2016 (Approximate)   BMI 23.86 kg/m   Body mass index is 23.86 kg/m.  General appearance : Well developed well nourished female. No acute distress HEENT: Eyes: no retinal hemorrhage or exudates,  Neck supple, trachea midline, no carotid bruits, no thyroidmegaly Lungs: Clear to auscultation, no rhonchi or wheezes, or rib retractions  Heart: Regular rate and rhythm, no murmurs or gallops Breast:Examined in sitting and supine position were symmetrical in appearance, no palpable masses or tenderness,  no skin retraction, no nipple inversion, no nipple discharge, no skin discoloration, no axillary or supraclavicular lymphadenopathy Abdomen: no palpable masses or tenderness, no rebound or guarding Extremities: no edema or skin discoloration or tenderness  Pelvic: Vulva: Normal             Vagina: No gross lesions or discharge  Cervix/Uterus absent             Adnexa  Without masses or tenderness  Anus: Normal   Assessment/Plan:  37y.o. female for annual exam   1. Well female exam with routine gynecological  exam Normal gynecologic exam status post total abdominal hysterectomy.  Pap test negative in the past and pathology of the cervix and uterus benign July 2018.  Breast exam normal.  Will do TSH and hemoglobin A1c today and follow-up for fasting health labs. - TSH - Hemoglobin A1C - Comp Met (CMET); Future - CBC; Future - Lipid panel; Future - VITAMIN D 25 Hydroxy (Vit-D Deficiency, Fractures); Future  2. 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  Rule out thyroid dysfunction and diabetes. - TSH - Hemoglobin  A1C  [name] MD, 3:40 PM 08/13/2017

## 2017-08-14 LAB — TSH: TSH: 1.61 m[IU]/L

## 2017-08-14 LAB — HEMOGLOBIN A1C
HEMOGLOBIN A1C: 5 %{Hb} (ref <5.7)
Mean Plasma Glucose: 97 (calc)
eAG (mmol/L): 5.4 (calc)

## 2018-01-05 DIAGNOSIS — H2102 Hyphema, left eye: Secondary | ICD-10-CM | POA: Diagnosis not present

## 2018-01-05 DIAGNOSIS — H26492 Other secondary cataract, left eye: Secondary | ICD-10-CM | POA: Diagnosis not present

## 2018-01-30 ENCOUNTER — Ambulatory Visit: Payer: 59 | Admitting: Family Medicine

## 2018-01-30 ENCOUNTER — Encounter: Payer: Self-pay | Admitting: Family Medicine

## 2018-01-30 VITALS — BP 116/78 | HR 73 | Temp 98.1°F | Ht 64.0 in | Wt 139.6 lb

## 2018-01-30 DIAGNOSIS — J3489 Other specified disorders of nose and nasal sinuses: Secondary | ICD-10-CM | POA: Diagnosis not present

## 2018-01-30 MED ORDER — AMOXICILLIN-POT CLAVULANATE 600-42.9 MG/5ML PO SUSR: 875.0000 mg | Freq: Two times a day (BID) | ORAL | 1 refills | Status: DC

## 2018-01-30 NOTE — Progress Notes (Signed)
   Shannon Lawrence is a 37 y.o. female here for an acute visit.  History of Present Illness:   Sinusitis  This is a new problem. The current episode started 1 to 4 weeks ago. The problem has been gradually worsening since onset. There has been no fever. The pain is moderate. Associated symptoms include sinus pressure. Past treatments include acetaminophen. The treatment provided no relief.   Patient has long history of dental issues and procedures. Recently had pain in the same area that led to root canal, fissure of infection to maxilla, osteomyelitis, and bone graft.   PMHx, SurgHx, SocialHx, Medications, and Allergies were reviewed in the Visit Navigator and updated as appropriate.  Current Medications:  No current outpatient medications on file.   Allergies  Allergen Reactions  . Sulfa Drugs Cross Reactors Other (See Comments)    Reaction unknown   Review of Systems:   Pertinent items are noted in the HPI. Otherwise, ROS is negative.  Vitals:   Vitals:   01/30/18 1055  BP: 116/78  Pulse: 73  Temp: 98.1 F (36.7 C)  TempSrc: Oral  SpO2: 100%  Weight: 139 lb 9.6 oz (63.3 kg)  Height: 5\' 4"  (1.626 m)     Body mass index is 23.96 kg/m.  Physical Exam:   Physical Exam  Constitutional: She appears well-nourished.  HENT:  Head: Normocephalic and atraumatic.  Nose: Right sinus exhibits maxillary sinus tenderness.  Mouth/Throat: No dental abscesses.  Eyes: Pupils are equal, round, and reactive to light. EOM are normal.  Neck: Normal range of motion. Neck supple.  Cardiovascular: Normal rate, regular rhythm, normal heart sounds and intact distal pulses.  Pulmonary/Chest: Effort normal.  Abdominal: Soft.  Skin: Skin is warm.  Psychiatric: She has a normal mood and affect. Her behavior is normal.  Nursing note and vitals reviewed.  Assessment and Plan:   [name] was seen today for sinus problem.  Diagnoses and all orders for this visit:  Sinus  pain Comments: Hopefully just sinusitis but high risk (see HPI). Will start Abx today. She will see her dentist for exam and xrays early next week. Orders: -     amoxicillin-clavulanate (AUGMENTIN ES-600) 600-42.9 MG/5ML suspension; Take 7.3 mLs (875 mg total) by mouth 2 (two) times daily for 10 days.   . Reviewed expectations re: course of current medical issues. . Discussed self-management of symptoms. . Outlined signs and symptoms indicating need for more acute intervention. . Patient verbalized understanding and all questions were answered. Marland Kitchen Health Maintenance issues including appropriate healthy diet, exercise, and smoking avoidance were discussed with patient. . See orders for this visit as documented in the electronic medical record. . Patient received an After Visit Summary.  Shannon Deutscher, DO [place], [place] [facility] 02/01/2018

## 2018-08-14 ENCOUNTER — Encounter: Payer: 59 | Admitting: Obstetrics & Gynecology

## 2018-08-17 ENCOUNTER — Other Ambulatory Visit: Payer: Self-pay

## 2018-08-17 ENCOUNTER — Ambulatory Visit (INDEPENDENT_AMBULATORY_CARE_PROVIDER_SITE_OTHER): Payer: 59 | Admitting: Obstetrics & Gynecology

## 2018-08-17 ENCOUNTER — Encounter: Payer: Self-pay | Admitting: Obstetrics & Gynecology

## 2018-08-17 VITALS — BP 144/92 | Ht 64.0 in | Wt 140.0 lb

## 2018-08-17 DIAGNOSIS — Z01419 Encounter for gynecological examination (general) (routine) without abnormal findings: Secondary | ICD-10-CM | POA: Diagnosis not present

## 2018-08-17 DIAGNOSIS — Z90710 Acquired absence of both cervix and uterus: Secondary | ICD-10-CM

## 2018-08-17 NOTE — Progress Notes (Signed)
Shannon Lawrence [birth year] [id number]   History:    38 y.o. G4P2A2L2 Married.  Sons 23 and 68 yo.  RP:  Established patient presenting for annual gyn exam   HPI: TAH/Bilateral Salpingectomy for Fibroids 10/2016.  Patho benign.  No pelvic pain.  Breasts normal.  Urine/BMs normal.  BMI 24.03.  Heath labs with Fam MD.  Past medical history,surgical history, family history and social history were all reviewed and documented in the EPIC chart.  Gynecologic History Patient's last menstrual period was 10/15/2016 (approximate). Contraception: status post hysterectomy Last Pap: 2018. Results were: Negative Last mammogram: Never Bone Density: Never Colonoscopy: Never  Obstetric History OB History  Gravida Para Term Preterm AB Living  4 2 2   2 2   SAB TAB Ectopic Multiple Live Births  2       1    # Outcome Date GA Lbr Len/2nd Weight Sex Delivery Anes PTL Lv  4 Term 12/19/11 [redacted]w[redacted]d 02:19 / 00:10 6 lb 10.9 oz (3.03 kg) M Vag-Spont None  LIV  3 SAB 09/18/10          2 SAB 04/19/10          1 Term 04/20/07             ROS: A ROS was performed and pertinent positives and negatives are included in the history.  GENERAL: No fevers or chills. HEENT: No change in vision, no earache, sore throat or sinus congestion. NECK: No pain or stiffness. CARDIOVASCULAR: No chest pain or pressure. No palpitations. PULMONARY: No shortness of breath, cough or wheeze. GASTROINTESTINAL: No abdominal pain, nausea, vomiting or diarrhea, melena or bright red blood per rectum. GENITOURINARY: No urinary frequency, urgency, hesitancy or dysuria. MUSCULOSKELETAL: No joint or muscle pain, no back pain, no recent trauma. DERMATOLOGIC: No rash, no itching, no lesions. ENDOCRINE: No polyuria, polydipsia, no heat or cold intolerance. No recent change in weight. HEMATOLOGICAL: No anemia or easy bruising or bleeding. NEUROLOGIC: No headache, seizures, numbness, tingling or weakness. PSYCHIATRIC: No depression, no loss of  interest in normal activity or change in sleep pattern.     Exam:   BP (!) 144/92    Ht 5\' 4"  (1.626 m)    Wt 140 lb (63.5 kg)    LMP 10/15/2016 (Approximate)    BMI 24.03 kg/m   Body mass index is 24.03 kg/m.  General appearance : Well developed well nourished female. No acute distress HEENT: Eyes: no retinal hemorrhage or exudates,  Neck supple, trachea midline, no carotid bruits, no thyroidmegaly Lungs: Clear to auscultation, no rhonchi or wheezes, or rib retractions  Heart: Regular rate and rhythm, no murmurs or gallops Breast:Examined in sitting and supine position were symmetrical in appearance, no palpable masses or tenderness,  no skin retraction, no nipple inversion, no nipple discharge, no skin discoloration, no axillary or supraclavicular lymphadenopathy Abdomen: no palpable masses or tenderness, no rebound or guarding Extremities: no edema or skin discoloration or tenderness  Pelvic: Vulva: Normal             Vagina: No gross lesions or discharge  Cervix/Uterus absent  Adnexa  Without masses or tenderness  Anus: Normal   Assessment/Plan:  38 y.o. female for annual exam   1. Well female exam with routine gynecological exam Gynecologic exam status post TAH bilateral salpingectomy for fibroids in July 2018.  Pathology of cervix on TAH was completely benign.  No indication to repeat the Pap test today.  Breast exam normal.  Will  schedule a screening mammogram at age 57 this year.  Health labs with family physician.  Body mass index good at 24.03.  Continue with fitness and healthy nutrition.  2. S/P total hysterectomy    [name] MD, 3:26 PM 08/17/2018

## 2018-08-18 ENCOUNTER — Encounter: Payer: Self-pay | Admitting: Obstetrics & Gynecology

## 2018-08-18 NOTE — Patient Instructions (Signed)
  1. Well female exam with routine gynecological exam Gynecologic exam status post TAH bilateral salpingectomy for fibroids in July 2018.  Pathology of cervix on TAH was completely benign.  No indication to repeat the Pap test today.  Breast exam normal.  Will schedule a screening mammogram at age 38 this year.  Health labs with family physician.  Body mass index good at 24.03.  Continue with fitness and healthy nutrition.  2. S/P total hysterectomy  Shannon Lawrence, it was a pleasure seeing you today!

## 2018-11-12 ENCOUNTER — Telehealth: Payer: Self-pay

## 2018-11-12 NOTE — Progress Notes (Signed)
Patient was not available at time of appointment- was later rescheduled for another provider visit but she canceled that visit as was feeling better.

## 2018-11-12 NOTE — Patient Instructions (Signed)
Health Maintenance Due  Topic Date Due  . INFLUENZA VACCINE  11/07/2018    Depression screen PHQ 2/9 07/16/2017  Decreased Interest 0  Down, Depressed, Hopeless 0  PHQ - 2 Score 0

## 2018-11-12 NOTE — Telephone Encounter (Signed)
   Copied from [place] [id number]. Topic: Appointment Scheduling - Scheduling Inquiry for Clinic >> Nov 12, 2018  8:23 AM Nils Flack wrote: Reason for CRM: [phone] Pt needs appt for abdomen pain, moderate llq  Office line busy >> Nov 12, 2018  8:35 AM Horton Chin M wrote: Please triage for abd pain

## 2018-11-13 ENCOUNTER — Ambulatory Visit: Payer: 59 | Admitting: Family Medicine

## 2019-04-16 ENCOUNTER — Other Ambulatory Visit: Payer: Self-pay

## 2019-04-16 ENCOUNTER — Ambulatory Visit: Payer: 59 | Attending: Internal Medicine

## 2019-04-16 ENCOUNTER — Ambulatory Visit (INDEPENDENT_AMBULATORY_CARE_PROVIDER_SITE_OTHER)
Admission: RE | Admit: 2019-04-16 | Discharge: 2019-04-16 | Disposition: A | Discharge status: Home / Self Care | Payer: 59 | Source: Ambulatory Visit

## 2019-04-16 DIAGNOSIS — Z20822 Contact with and (suspected) exposure to covid-19: Secondary | ICD-10-CM

## 2019-04-16 DIAGNOSIS — B349 Viral infection, unspecified: Secondary | ICD-10-CM

## 2019-04-16 NOTE — ED Provider Notes (Signed)
Virtual Visit via Video Note:  NICKY RAULSTON  initiated request for Telemedicine visit with [facility] Ur[facility] team. I connected with Etta Quill  on 04/16/2019 at 11:18 AM  for a synchronized telemedicine visit using a video enabled HIPPA compliant telemedicine application. I verified that I am speaking with Etta Quill  using two identifiers. Sharion Balloon, NP  was physically located in a [facility] Urgent care site and TAYLORMARIE ELLYSON was located at a different location.   The limitations of evaluation and management by telemedicine as well as the availability of in-person appointments were discussed. Patient was informed that she  may incur a bill ( including co-pay) for this virtual visit encounter. Etta Quill  expressed understanding and gave verbal consent to proceed with virtual visit.     History of Present Illness:Lanai L Bisch  is a 39 y.o. female presents for evaluation of 2 day history of headache, dizziness, ear pain, and sinus pressure.  Taking Tylenol for symptoms with some improvement.  No dizziness today.  Denies fever, chills, sore throat, cough, shortness of breath, vomiting, diarrhea, rash, or other symptoms.  Has not had COVID test.     Allergies  Allergen Reactions  . Sulfa Drugs Cross Reactors Other (See Comments)    Reaction unknown     Past Medical History:  Diagnosis Date  . Cyst (solitary) of breast left breast   patient denies this is a part of her medical history 10/28/16  . Diabetes mellitus    gestational     Social History   Tobacco Use  . Smoking status: Never Smoker  . Smokeless tobacco: Never Used  Substance Use Topics  . Alcohol use: Yes    Comment: social  . Drug use: No        Observations/Objective: Physical Exam  VITALS: Patient denies fever. GENERAL: Alert, appears well and in no acute distress. HEENT: Atraumatic. NECK: Normal movements of the head and neck. CARDIOPULMONARY: No increased WOB.  Speaking in clear sentences. I:E ratio WNL.  MS: Moves all visible extremities without noticeable abnormality. PSYCH: Pleasant and cooperative, well-groomed. Speech normal rate and rhythm. Affect is appropriate. Insight and judgement are appropriate. Attention is focused, linear, and appropriate.  NEURO: CN grossly intact. Oriented as arrived to appointment on time with no prompting. Moves both UE equally.  SKIN: No obvious lesions, wounds, erythema, or cyanosis noted on face or hands.   Assessment and Plan:    ICD-10-CM   1. Viral illness  B34.9        Follow Up Instructions: Instructed patient to take Tylenol or ibuprofen as needed for fever or discomfort and over-the-counter Mucinex as needed for nasal congestion.  Discussed options for COVID testing with patient.  Instructed her to follow-up with her PCP or come here to be seen in person if her symptoms are not improving.  Patient agrees to plan of care.    I discussed the assessment and treatment plan with the patient. The patient was provided an opportunity to ask questions and all were answered. The patient agreed with the plan and demonstrated an understanding of the instructions.   The patient was advised to call back or seek an in-person evaluation if the symptoms worsen or if the condition fails to improve as anticipated.      Sharion Balloon, NP  04/16/2019 11:18 AM         Sharion Balloon, NP 04/16/19 1118

## 2019-04-16 NOTE — Discharge Instructions (Addendum)
Take Tylenol or ibuprofen as needed for discomfort or fever.  Take over-the-counter Mucinex as needed for nasal congestion.    Schedule yourself for a COVID test.    With your primary care provider or come here to be seen in person if your symptoms are not improving.

## 2019-04-18 LAB — NOVEL CORONAVIRUS, NAA: SARS-CoV-2, NAA: NOT DETECTED

## 2019-07-14 ENCOUNTER — Telehealth: Payer: Self-pay | Admitting: Family Medicine

## 2019-07-14 NOTE — Telephone Encounter (Signed)
Do you prefer in office or virtual visit?

## 2019-07-14 NOTE — Telephone Encounter (Signed)
Nurse Assessment Nurse: Raphael Gibney, RN, Vanita Ingles Date/Time (Eastern Time): 07/14/2019 9:15:30 AM Confirm and document reason for call. If symptomatic, describe symptoms. ---Caller states she has sinus congestion. has been taking decongestants which help. Dizziness started yesterday. BP 138/79. no fever. She is light headed. Has the patient had close contact with a person known or suspected to have the novel coronavirus illness OR traveled / lives in area with major community spread (including international travel) in the last 14 days from the onset of symptoms? * If Asymptomatic, screen for exposure and travel within the last 14 days. ---No Does the patient have any new or worsening symptoms? ---Yes Will a triage be completed? ---Yes Related visit to physician within the last 2 weeks? ---No Does the PT have any chronic conditions? (i.e. diabetes, asthma, this includes High risk factors for pregnancy, etc.) ---No Is the patient pregnant or possibly pregnant? (Ask all females between the ages of 36-55) ---No Is this a behavioral health or substance abuse call? ---No Guidelines Guideline Title Affirmed Question Affirmed Notes Nurse Date/Time (Eastern Time) Dizziness - Lightheadedness [1] MILD dizziness (e.g., walking normally) AND [2] has NOT been evaluated by physician for this (Exception: dizziness caused by heat exposure, Raphael Gibney, RN, Vanita Ingles 07/14/2019 9:18:25 AMPLEASE NOTE: All timestamps contained within this report are represented as [place] Standard Time. CONFIDENTIALTY NOTICE: This fax transmission is intended only for the addressee. It contains information that is legally privileged, confidential or otherwise protected from use or disclosure. If you are not the intended recipient, you are strictly prohibited from reviewing, disclosing, copying using or disseminating any of this information or taking any action in reliance on or regarding this information. If you have received this fax  in error, please notify us immediately by telephone so that we can arrange for its return to [place]. Phone: [phone], Toll-Free: [phone], Fax: [phone] Page: 2 of 2 Call Id: [id number] Guidelines Guideline Title Affirmed Question Affirmed Notes Nurse Date/Time Eilene Ghazi Time) sudden standing, or poor fluid intake) Disp. Time Eilene Ghazi Time) Disposition Final User 07/14/2019 9:23:57 AM SEE PCP WITHIN 3 DAYS Yes Raphael Gibney, RN, Vanita Ingles Caller Disagree/Comply Comply Caller Understands Yes PreDisposition Call Doctor Care Advice Given Per Guideline SEE PCP WITHIN 3 DAYS: * You need to be seen within 2 or 3 days. Call your doctor (or NP/PA) during regular office hours and make an appointment. A clinic or urgent care center are good places to go for care if your doctor's office is closed or you can't get an appointment. NOTE: If office will be open tomorrow, tell caller to call then, not in 3 days. CALL BACK IF: * Passes out (faints) * You become worse. CARE ADVICE given per Dizziness (Adult) guideline

## 2019-07-14 NOTE — Telephone Encounter (Signed)
Looks like this patient hasn't yet had a TOC appt.  Needs to get schedule with someone to be her pcp.   She is seeing me tomorrow as an acute?; if sinus or cold related, needs virtual. Thanks.

## 2019-07-14 NOTE — Telephone Encounter (Signed)
Called and spoke with pt. Symptoms she is experiencing are not related to COVID or Sinus issues. Scheduled TOC with Dr. Jonni Sanger in June

## 2019-07-15 ENCOUNTER — Other Ambulatory Visit: Payer: Self-pay

## 2019-07-15 ENCOUNTER — Telehealth: Payer: Self-pay | Admitting: Family Medicine

## 2019-07-15 ENCOUNTER — Emergency Department (HOSPITAL_BASED_OUTPATIENT_CLINIC_OR_DEPARTMENT_OTHER): Payer: 59

## 2019-07-15 ENCOUNTER — Encounter: Payer: Self-pay | Admitting: Family Medicine

## 2019-07-15 ENCOUNTER — Emergency Department (HOSPITAL_BASED_OUTPATIENT_CLINIC_OR_DEPARTMENT_OTHER)
Admission: EM | Admit: 2019-07-15 | Discharge: 2019-07-15 | Disposition: A | Discharge status: Home / Self Care | Payer: 59 | Attending: Emergency Medicine | Admitting: Emergency Medicine

## 2019-07-15 ENCOUNTER — Encounter (HOSPITAL_BASED_OUTPATIENT_CLINIC_OR_DEPARTMENT_OTHER): Payer: Self-pay | Admitting: Emergency Medicine

## 2019-07-15 ENCOUNTER — Emergency Department (HOSPITAL_COMMUNITY): Payer: 59

## 2019-07-15 ENCOUNTER — Ambulatory Visit (INDEPENDENT_AMBULATORY_CARE_PROVIDER_SITE_OTHER): Payer: 59 | Admitting: Family Medicine

## 2019-07-15 VITALS — BP 112/78 | HR 83 | Temp 97.5°F | Resp 16 | Ht 64.0 in | Wt 139.8 lb

## 2019-07-15 DIAGNOSIS — R42 Dizziness and giddiness: Secondary | ICD-10-CM | POA: Diagnosis present

## 2019-07-15 DIAGNOSIS — H8113 Benign paroxysmal vertigo, bilateral: Secondary | ICD-10-CM

## 2019-07-15 DIAGNOSIS — Z79899 Other long term (current) drug therapy: Secondary | ICD-10-CM | POA: Diagnosis not present

## 2019-07-15 DIAGNOSIS — J309 Allergic rhinitis, unspecified: Secondary | ICD-10-CM

## 2019-07-15 DIAGNOSIS — R200 Anesthesia of skin: Secondary | ICD-10-CM | POA: Diagnosis not present

## 2019-07-15 DIAGNOSIS — Z8709 Personal history of other diseases of the respiratory system: Secondary | ICD-10-CM

## 2019-07-15 HISTORY — DX: Allergic rhinitis, unspecified: J30.9

## 2019-07-15 LAB — COMPREHENSIVE METABOLIC PANEL
ALT: 15 U/L (ref 0–44)
AST: 18 U/L (ref 15–41)
Albumin: 4.5 g/dL (ref 3.5–5.0)
Alkaline Phosphatase: 57 U/L (ref 38–126)
Anion gap: 8 (ref 5–15)
BUN: 10 mg/dL (ref 6–20)
CO2: 27 mmol/L (ref 22–32)
Calcium: 9.5 mg/dL (ref 8.9–10.3)
Chloride: 104 mmol/L (ref 98–111)
Creatinine, Ser: 0.71 mg/dL (ref 0.44–1.00)
GFR calc Af Amer: >60 mL/min (ref >60)
GFR calc non Af Amer: >60 mL/min (ref >60)
Glucose, Bld: 109 mg/dL — ABNORMAL HIGH (ref 70–99)
Potassium: 3.4 mmol/L — ABNORMAL LOW (ref 3.5–5.1)
Sodium: 139 mmol/L (ref 135–145)
Total Bilirubin: 0.7 mg/dL (ref 0.3–1.2)
Total Protein: 7.8 g/dL (ref 6.5–8.1)

## 2019-07-15 LAB — CBC WITH DIFFERENTIAL/PLATELET
Abs Immature Granulocytes: 0.03 10*3/uL (ref 0.00–0.07)
Basophils Absolute: 0.1 10*3/uL (ref 0.0–0.1)
Basophils Relative: 1 %
Eosinophils Absolute: 0.4 10*3/uL (ref 0.0–0.5)
Eosinophils Relative: 4 %
HCT: 41.3 % (ref 36.0–46.0)
Hemoglobin: 14.1 g/dL (ref 12.0–15.0)
Immature Granulocytes: 0 %
Lymphocytes Relative: 20 %
Lymphs Abs: 2 10*3/uL (ref 0.7–4.0)
MCH: 31.7 pg (ref 26.0–34.0)
MCHC: 34.1 g/dL (ref 30.0–36.0)
MCV: 92.8 fL (ref 80.0–100.0)
Monocytes Absolute: 0.8 10*3/uL (ref 0.1–1.0)
Monocytes Relative: 8 %
Neutro Abs: 6.4 10*3/uL (ref 1.7–7.7)
Neutrophils Relative %: 67 %
Platelets: 384 10*3/uL (ref 150–400)
RBC: 4.45 MIL/uL (ref 3.87–5.11)
RDW: 11 % — ABNORMAL LOW (ref 11.5–15.5)
WBC: 9.6 10*3/uL (ref 4.0–10.5)
nRBC: 0 % (ref 0.0–0.2)

## 2019-07-15 LAB — URINALYSIS, ROUTINE W REFLEX MICROSCOPIC
Bilirubin Urine: NEGATIVE
Glucose, UA: NEGATIVE mg/dL
Hgb urine dipstick: NEGATIVE
Ketones, ur: NEGATIVE mg/dL
Leukocytes,Ua: NEGATIVE
Nitrite: NEGATIVE
Protein, ur: NEGATIVE mg/dL
Specific Gravity, Urine: <1.005 — ABNORMAL LOW (ref 1.005–1.030)
pH: 7 (ref 5.0–8.0)

## 2019-07-15 MED ORDER — MECLIZINE HCL 25 MG PO TABS: 25.0000 mg | ORAL_TABLET | Freq: Three times a day (TID) | ORAL | 0 refills | Status: DC | PRN

## 2019-07-15 MED ORDER — SODIUM CHLORIDE 0.9 % IV BOLUS
1000.0000 mL | Freq: Once | INTRAVENOUS | Status: AC
  Administered 2019-07-15: 1000 mL via INTRAVENOUS

## 2019-07-15 NOTE — Discharge Instructions (Signed)
Continue meclizine as needed.   Your MRI showed no stroke but does show some sinus problems.  Please follow-up with ENT and neurology.  Return to the ER if you have worsening numbness and dizziness and headaches.

## 2019-07-15 NOTE — Telephone Encounter (Signed)
Patient called in stating that she was seen by Dr. Jonni Sanger this morning for dizziness. After taking her second dose of MECLIZINE 25 mg, she started experiencing numbness in left arm. Advised that today was PCP's half day, but that I would route message to another provider. Also informed patient that she could be seen at [facility] or ER. States that she doesn't feel comfortable driving and has two children at home. Please advise

## 2019-07-15 NOTE — Patient Instructions (Signed)
Please as scheduled for [facility] appointment.  It was a pleasure meeting you today! Thank you for choosing [place] to meet your healthcare needs! I truly look forward to working with you. If you have any questions or concerns, please send me a message via Mychart or call the office at [phone].   Benign Positional Vertigo Vertigo is the feeling that you or your surroundings are moving when they are not. Benign positional vertigo is the most common form of vertigo. This is usually a harmless condition (benign). This condition is positional. This means that symptoms are triggered by certain movements and positions. This condition can be dangerous if it occurs while you are doing something that could cause harm to you or others. This includes activities such as driving or operating machinery. What are the causes? In many cases, the cause of this condition is not known. It may be caused by a disturbance in an area of the inner ear that helps your brain to sense movement and balance. This disturbance can be caused by:  Viral infection (labyrinthitis).  Head injury.  Repetitive motion, such as jumping, dancing, or running. What increases the risk? You are more likely to develop this condition if:  You are a woman.  You are 2 years of age or older. What are the signs or symptoms? Symptoms of this condition usually happen when you move your head or your eyes in different directions. Symptoms may start suddenly, and usually last for less than a minute. They include:  Loss of balance and falling.  Feeling like you are spinning or moving.  Feeling like your surroundings are spinning or moving.  Nausea and vomiting.  Blurred vision.  Dizziness.  Involuntary eye movement (nystagmus). Symptoms can be mild and cause only minor problems, or they can be severe and interfere with daily life. Episodes of benign positional vertigo may return (recur) over time. Symptoms may improve over time. How is  this diagnosed? This condition may be diagnosed based on:  Your medical history.  Physical exam of the head, neck, and ears.  Tests, such as: ? MRI. ? CT scan. ? Eye movement tests. Your health care provider may ask you to change positions quickly while he or she watches you for symptoms of benign positional vertigo, such as nystagmus. Eye movement may be tested with a variety of exams that are designed to evaluate or stimulate vertigo. ? An electroencephalogram (EEG). This records electrical activity in your brain. ? Hearing tests. You may be referred to a health care provider who specializes in ear, nose, and throat (ENT) problems (otolaryngologist) or a provider who specializes in disorders of the nervous system (neurologist). How is this treated?  This condition may be treated in a session in which your health care provider moves your head in specific positions to adjust your inner ear back to normal. Treatment for this condition may take several sessions. Surgery may be needed in severe cases, but this is rare. In some cases, benign positional vertigo may resolve on its own in 2-4 weeks. Follow these instructions at home: Safety  Move slowly. Avoid sudden body or head movements or certain positions, as told by your health care provider.  Avoid driving until your health care provider says it is safe for you to do so.  Avoid operating heavy machinery until your health care provider says it is safe for you to do so.  Avoid doing any tasks that would be dangerous to you or others if vertigo occurs.  If you have trouble walking or keeping your balance, try using a cane for stability. If you feel dizzy or unstable, sit down right away.  Return to your normal activities as told by your health care provider. Ask your health care provider what activities are safe for you. General instructions  Take over-the-counter and prescription medicines only as told by your health care  provider.  Drink enough fluid to keep your urine pale yellow.  Keep all follow-up visits as told by your health care provider. This is important. Contact a health care provider if:  You have a fever.  Your condition gets worse or you develop new symptoms.  Your family or friends notice any behavioral changes.  You have nausea or vomiting that gets worse.  You have numbness or a "pins and needles" sensation. Get help right away if you:  Have difficulty speaking or moving.  Are always dizzy.  Faint.  Develop severe headaches.  Have weakness in your legs or arms.  Have changes in your hearing or vision.  Develop a stiff neck.  Develop sensitivity to light. Summary  Vertigo is the feeling that you or your surroundings are moving when they are not. Benign positional vertigo is the most common form of vertigo.  The cause of this condition is not known. It may be caused by a disturbance in an area of the inner ear that helps your brain to sense movement and balance.  Symptoms include loss of balance and falling, feeling that you or your surroundings are moving, nausea and vomiting, and blurred vision.  This condition can be diagnosed based on symptoms, physical exam, and other tests, such as MRI, CT scan, eye movement tests, and hearing tests.  Follow safety instructions as told by your health care provider. You will also be told when to contact your health care provider in case of problems. This information is not intended to replace advice given to you by your health care provider. Make sure you discuss any questions you have with your health care provider. Document Revised: 09/03/2017 Document Reviewed: 09/03/2017 Elsevier Patient Education  [place].

## 2019-07-15 NOTE — ED Triage Notes (Signed)
Dizziness since Tuesday. Went to PCP and given meclizine. She went to work and developed numbness to L arm from elbow to hand at 10:00 this morning. Denies weakness, speech clear.

## 2019-07-15 NOTE — Progress Notes (Signed)
Subjective  CC:  Chief Complaint  Patient presents with  . Dizziness    reoccuring incident, started Tuesday, BP yesterday 139/70    Same day acute visit; PCP not available. New pt to me. Chart reviewed.  Has TOC appt with me in June. Last here in office in 2019.  HPI: Shannon Lawrence is a 39 y.o. female who presents to the office today to address the problems listed above in the chief complaint.  39 yo healthy female with chronic allergies describes episodes of dizziness/lightheadedness on and off over the last few days. No associated n/v, headaches, paresis, ataxia, palpitations or sweats. Reports dental pain and sinus congestion treated for sinusitis about 3-4 weeks ago by her dentist with amox liquid. Her sxs improved but then returned. She self treated with zyrtec and decongestant which helped; last dose was Monday. Dizziness started Tuesday. Has congestion mild w/o dental pain or fevers or malaise or sinus pain now. No cough.   Reports chronic allergies managed with prn zyrtec. Added decongestants as needed. Interferes with sleep but typically will not feel lightheaded with these meds.    Assessment  1. Benign paroxysmal positional vertigo due to bilateral vestibular disorder   2. History of sinusitis   3. Chronic allergic rhinitis      Plan   BPPV:  After infection; education given. No red flags identified. Treat symptomatically with meclizine. See avs  Chronic allergies: continue zyrtec. Hold off on decongestant for now given lightheadedness. Keep hydrated.   H/o sinusitis treated for 7 days: call me if dental pain recurs for another longer course of abx. Pt agrees.   Follow up: Return for as scheduled.  09/29/2019  No orders of the defined types were placed in this encounter.  Meds ordered this encounter  Medications  . meclizine (ANTIVERT) 25 MG tablet    Sig: Take 1 tablet (25 mg total) by mouth 3 (three) times daily as needed for dizziness.    Dispense:  30  tablet    Refill:  0      I reviewed the patients updated PMH, FH, and SocHx.    Patient Active Problem List   Diagnosis Date Noted  . Chronic allergic rhinitis 07/15/2019  . Tinea corporis 05/09/2016   Current Meds  Medication Sig  . cetirizine (ZYRTEC) 10 MG tablet Take 10 mg by mouth daily.    Allergies: Patient is allergic to sulfa drugs cross reactors. Family History: Patient family history includes Brain cancer in her maternal grandmother; COPD in an other family member; Cancer in her maternal grandfather and another family member; Diabetes in her paternal grandfather; Hyperlipidemia in an other family member; Hypertension in her mother; Stroke in her paternal grandmother. Social History:  Patient  reports that she has never smoked. She has never used smokeless tobacco. She reports current alcohol use. She reports that she does not use drugs.  Review of Systems: Constitutional: Negative for fever malaise or anorexia Cardiovascular: negative for chest pain Respiratory: negative for SOB or persistent cough Gastrointestinal: negative for abdominal pain  Objective  Vitals: BP 112/78   Pulse 83   Temp (!) 97.5 F (36.4 C) (Temporal)   Resp 16   Ht 5\' 4"  (1.626 m)   Wt 139 lb 12.8 oz (63.4 kg)   LMP 10/15/2016 (Approximate)   SpO2 99%   BMI 24.00 kg/m  General: no acute distress , A&Ox3 HEENT: PEERL, conjunctiva normal, neck is supple, nasal mucosa is inflamed.TMs obstructed with wax,clear OP, no LAD  or sinus ttp Neg nystagmus with hall pike maneuver but reproduces dizziness Cardiovascular:  RRR without murmur or gallop.  Respiratory:  Good breath sounds bilaterally, CTAB with normal respiratory effort Skin:  Warm, no rashes Neuro: nonfocal normal gait     Commons side effects, risks, benefits, and alternatives for medications and treatment plan prescribed today were discussed, and the patient expressed understanding of the given instructions. Patient is  instructed to call or message via MyChart if he/she has any questions or concerns regarding our treatment plan. No barriers to understanding were identified. We discussed Red Flag symptoms and signs in detail. Patient expressed understanding regarding what to do in case of urgent or emergency type symptoms.   Medication list was reconciled, printed and provided to the patient in AVS. Patient instructions and summary information was reviewed with the patient as documented in the AVS. This note was prepared with assistance of Dragon voice recognition software. Occasional wrong-word or sound-a-like substitutions may have occurred due to the inherent limitations of voice recognition software  This visit occurred during the SARS-CoV-2 public health emergency.  Safety protocols were in place, including screening questions prior to the visit, additional usage of staff PPE, and extensive cleaning of exam room while observing appropriate contact time as indicated for disinfecting solutions.

## 2019-07-15 NOTE — ED Provider Notes (Signed)
[place] EMERGENCY DEPARTMENT Provider Note   CSN: [id number] Arrival date & time: 07/15/19  1457     History Chief Complaint  Patient presents with  . Dizziness  . Numbness    Shannon Lawrence is a 39 y.o. female.  Patient is a 39 year old female with past medical history of prior hysterectomy, sinus issues.  She presents today for evaluation of dizziness.  Patient describes a sensation of feeling dizzy/lightheaded that started approximately 3 days ago.  She was seen by her primary doctor and prescribed meclizine.  This morning she began feeling some numbness in her left arm from her elbow through her hand.  She spoke with her primary doctor who instructed her to come to the ER to be evaluated.  She denies any weakness, headache, or visual disturbances.  The history is provided by the patient.  Dizziness Quality:  Lightheadedness Severity:  Mild Onset quality:  Sudden Duration:  3 days Timing:  Constant Progression:  Waxing and waning Relieved by:  Nothing Worsened by:  Nothing Ineffective treatments: Meclizine. Associated symptoms: no blood in stool, no diarrhea, no nausea, no palpitations and no vomiting        Past Medical History:  Diagnosis Date  . Cyst (solitary) of breast left breast   patient denies this is a part of her medical history 10/28/16  . History of gestational diabetes     Patient Active Problem List   Diagnosis Date Noted  . Chronic allergic rhinitis 07/15/2019  . Tinea corporis 05/09/2016    Past Surgical History:  Procedure Laterality Date  . ABDOMINAL HYSTERECTOMY N/A 11/05/2016   Procedure: TOTAL ABDOMINAL HYSTERECTOMY;  Surgeon: [name], MD;  Location: [facility];  Service: Gynecology;  Laterality: N/A;  . BILATERAL SALPINGECTOMY Bilateral 11/05/2016   Procedure: BILATERAL SALPINGECTOMY;  Surgeon: [name], MD;  Location: [facility];  Service: Gynecology;  Laterality: Bilateral;  . EYE SURGERY     2000 left eye    . ROBOTIC ASSISTED TOTAL HYSTERECTOMY WITH SALPINGECTOMY Bilateral 11/05/2016   Procedure: ATTEMPTED ROBOTIC ASSISTED TOTAL HYSTERECTOMY WITH BILATERAL SALPINGECTOMY;  Surgeon: [name], MD;  Location: [facility];  Service: Gynecology;  Laterality: Bilateral;     OB History    Gravida  4   Para  2   Term  2   Preterm      AB  2   Living  2     SAB  2   TAB      Ectopic      Multiple      Live Births  1           Family History  Problem Relation Age of Onset  . Brain cancer Maternal Grandmother   . Cancer Other   . COPD Other   . Hyperlipidemia Other   . Hypertension Mother   . Cancer Maternal Grandfather        stomach  . Stroke Paternal Grandmother   . Diabetes Paternal Grandfather     Social History   Tobacco Use  . Smoking status: Never Smoker  . Smokeless tobacco: Never Used  Substance Use Topics  . Alcohol use: Yes    Comment: social  . Drug use: No    Home Medications Prior to Admission medications   Medication Sig Start Date End Date Taking? Authorizing Provider  cetirizine (ZYRTEC) 10 MG tablet Take 10 mg by mouth daily.    [provider]  meclizine (ANTIVERT) 25 MG tablet Take 1 tablet (25  mg total) by mouth 3 (three) times daily as needed for dizziness. 07/15/19   Leamon Arnt, MD    Allergies    Sulfa drugs cross reactors  Review of Systems   Review of Systems  Cardiovascular: Negative for palpitations.  Gastrointestinal: Negative for blood in stool, diarrhea, nausea and vomiting.  Neurological: Positive for dizziness.  All other systems reviewed and are negative.   Physical Exam Updated Vital Signs BP (!) 158/64 (BP Location: Left Arm)   Pulse 94   Temp 97.9 F (36.6 C) (Oral)   Resp 18   Ht 5\' 3"  (1.6 m)   Wt 63 kg   LMP 10/15/2016 (Approximate)   SpO2 100%   BMI 24.62 kg/m   Physical Exam Vitals and nursing note reviewed.  Constitutional:      General: She is not in acute distress.     Appearance: She is well-developed. She is not diaphoretic.  HENT:     Head: Normocephalic and atraumatic.  Eyes:     Extraocular Movements: Extraocular movements intact.     Pupils: Pupils are equal, round, and reactive to light.  Cardiovascular:     Rate and Rhythm: Normal rate and regular rhythm.     Heart sounds: No murmur. No friction rub. No gallop.   Pulmonary:     Effort: Pulmonary effort is normal. No respiratory distress.     Breath sounds: Normal breath sounds. No wheezing.  Abdominal:     General: Bowel sounds are normal. There is no distension.     Palpations: Abdomen is soft.     Tenderness: There is no abdominal tenderness.  Musculoskeletal:        General: Normal range of motion.     Cervical back: Normal range of motion and neck supple.  Skin:    General: Skin is warm and dry.  Neurological:     General: No focal deficit present.     Mental Status: She is alert and oriented to person, place, and time.     Cranial Nerves: No cranial nerve deficit.     Motor: No weakness.     Coordination: Coordination normal.     Gait: Gait normal.     ED Results / Procedures / Treatments   Labs (all labs ordered are listed, but only abnormal results are displayed) Labs Reviewed  COMPREHENSIVE METABOLIC PANEL  CBC WITH DIFFERENTIAL/PLATELET  URINALYSIS, ROUTINE W REFLEX MICROSCOPIC    EKG EKG Interpretation  Date/Time:  Thursday July 15 2019 15:35:16 EDT Ventricular Rate:  84 PR Interval:    QRS Duration: 100 QT Interval:  377 QTC Calculation: 446 R Axis:   67 Text Interpretation: Sinus rhythm Probable left atrial enlargement Borderline repolarization abnormality No prior ecg for comparison Confirmed by Veryl Speak [phone]) on 07/15/2019 3:46:26 PM   Radiology No results found.  Procedures Procedures (including critical care time)  Medications Ordered in ED Medications  sodium chloride 0.9 % bolus 1,000 mL (has no administration in time range)    ED  Course  I have reviewed the triage vital signs and the nursing notes.  Pertinent labs & imaging results that were available during my care of the patient were reviewed by me and considered in my medical decision making (see chart for details).    MDM Rules/Calculators/A&P  Patient presenting here with dizziness for the past several days, then developing left arm numbness this morning.  She was referred by her primary doctor for further work-up as she had no improvement with  meclizine and new neurologic deficit of arm numbness.  Patient's work-up today shows negative head CT and unremarkable laboratory studies.  She is neurologically intact and her symptoms are subjective in nature.  In discussion with the patient and husband, we have come to the decision to transfer her to [facility] to undergo MRI of the brain to rule out stroke.  I have spoken with Dr. Laverta Baltimore who agrees to accept patient in transfer.  Final Clinical Impression(s) / ED Diagnoses Final diagnoses:  None    Rx / DC Orders ED Discharge Orders    None       Veryl Speak, MD 07/15/19 2254

## 2019-07-15 NOTE — ED Triage Notes (Signed)
To ED via [facility] from [facility] for MRI. Pt alert and oriented.

## 2019-07-15 NOTE — ED Notes (Signed)
ED Provider at bedside. 

## 2019-07-15 NOTE — Telephone Encounter (Signed)
Spoke with patient, gave verbal understanding of recommendations per Dr. Rogers Blocker

## 2019-07-15 NOTE — ED Provider Notes (Signed)
  Physical Exam  BP 119/78 (BP Location: Left Arm)   Pulse 90   Temp 98.1 F (36.7 C) (Oral)   Resp 16   Ht 5\' 3"  (1.6 m)   Wt 63 kg   LMP 10/15/2016 (Approximate)   SpO2 99%   BMI 24.62 kg/m   Physical Exam  ED Course/Procedures     Procedures  MDM  Patient is transferred from [facility] for [facility] brain.  Patient has been having dizziness as well as left arm numbness.  MRI showed no stroke.  Patient does have right-sided sinus disease.  Patient does not have ENT follow-up so we will have her follow-up with ENT.  Patient does have some subjective numbness in the left arm still.  I wonder if she has some peripheral neuropathy.  Will refer to neurology and ENT outpatient.    Drenda Freeze, MD 07/15/19 2214

## 2019-07-15 NOTE — ED Notes (Signed)
Patient transported to CT 

## 2019-07-15 NOTE — Telephone Encounter (Signed)
Would advise to discontinue meclizine and f/u with PCP but advise she Go to ER if numbness, slurred speech, facial dropping, etc...   Orma Flaming, MD [place]

## 2019-07-16 NOTE — Telephone Encounter (Signed)
Reviewed ER notes and brain CT and MRI: + chronic sinusitis and was referred to ENT. Didn't tolerate the meclizine. No neuro findings on exam or imaging.

## 2019-07-22 ENCOUNTER — Encounter: Payer: Self-pay | Admitting: Family Medicine

## 2019-07-23 DIAGNOSIS — J320 Chronic maxillary sinusitis: Secondary | ICD-10-CM | POA: Insufficient documentation

## 2019-07-23 DIAGNOSIS — J322 Chronic ethmoidal sinusitis: Secondary | ICD-10-CM | POA: Insufficient documentation

## 2019-07-29 ENCOUNTER — Telehealth: Payer: 59 | Admitting: Family Medicine

## 2019-07-30 ENCOUNTER — Ambulatory Visit (INDEPENDENT_AMBULATORY_CARE_PROVIDER_SITE_OTHER): Payer: 59 | Admitting: Family Medicine

## 2019-07-30 ENCOUNTER — Other Ambulatory Visit: Payer: Self-pay

## 2019-07-30 ENCOUNTER — Encounter: Payer: Self-pay | Admitting: Family Medicine

## 2019-07-30 VITALS — BP 116/80 | HR 88 | Temp 98.5°F | Resp 15 | Ht 64.0 in | Wt 139.0 lb

## 2019-07-30 DIAGNOSIS — G5602 Carpal tunnel syndrome, left upper limb: Secondary | ICD-10-CM

## 2019-07-30 DIAGNOSIS — J320 Chronic maxillary sinusitis: Secondary | ICD-10-CM | POA: Diagnosis not present

## 2019-07-30 DIAGNOSIS — J322 Chronic ethmoidal sinusitis: Secondary | ICD-10-CM | POA: Diagnosis not present

## 2019-07-30 DIAGNOSIS — J309 Allergic rhinitis, unspecified: Secondary | ICD-10-CM | POA: Diagnosis not present

## 2019-07-30 DIAGNOSIS — R42 Dizziness and giddiness: Secondary | ICD-10-CM

## 2019-07-30 LAB — POCT GLYCOSYLATED HEMOGLOBIN (HGB A1C): Hemoglobin A1C: 4.9 % (ref 4.0–5.6)

## 2019-07-30 NOTE — Progress Notes (Signed)
Subjective  CC:  Chief Complaint  Patient presents with  . possible diabetes    diagnosed with gestational diabetes with both pregnancies, had been researching online and alot of current symptoms she is experiencing can be side effects of diabetes  . Dizziness    went to ER, all labs/tests normal, has seen ENT- completely blocked right sinus - on antbiotic     HPI: Shannon Lawrence is a 39 y.o. female who presents to the office today to address the problems listed above in the chief complaint.  I reviewed her chart and er visit and ent visit and results/mri/ct scan and lab results.  Pt has chronic allergies and secondary chronic sinusitis: on medical tx now. Has had sxs of lightheadedness, fatigue and not feeling well. Is worried there is an underlying systemic illness causing her problems. No polyuria, polydypsia or polyphagia. Has h/o GDM. Reports intermittent left hand numbness in a few fingers while at work. No nighttime sxs or weakness. Had left arm heaviness after meclizine. No further episodes of weakness/numbness.    Assessment  1. Lightheadedness   2. Chronic allergic rhinitis   3. Chronic ethmoidal sinusitis   4. Chronic maxillary sinusitis   5. Left carpal tunnel syndrome      Plan   Sinusitis and allergies: reassured. These problems can explain her sxs. Agree with abx and f/u with ent. Once infection is controlled and allergies are controlled, she can expect to feel better. F/u here if not.   CTS: mild. Educated. Prn treatment. Reassured.  Nl a1c. No diabetes  Follow up: as scheduled  09/29/2019  Orders Placed This Encounter  Procedures  . POCT glycosylated hemoglobin (Hb A1C)   No orders of the defined types were placed in this encounter.     I reviewed the patients updated PMH, FH, and SocHx.    Patient Active Problem List   Diagnosis Date Noted  . Chronic ethmoidal sinusitis 07/23/2019  . Chronic maxillary sinusitis 07/23/2019  . Chronic allergic  rhinitis 07/15/2019  . Tinea corporis 05/09/2016   Current Meds  Medication Sig  . Amoxicillin-Pot Clavulanate (AUGMENTIN PO) Take by mouth. 10 mL twice daily for 20 days per Dr. Redmond Baseman ENT  . meclizine (ANTIVERT) 25 MG tablet Take 1 tablet (25 mg total) by mouth 3 (three) times daily as needed for dizziness.    Allergies: Patient is allergic to sulfa drugs cross reactors. Family History: Patient family history includes Brain cancer in her maternal grandmother; COPD in an other family member; Cancer in her maternal grandfather and another family member; Diabetes in her paternal grandfather; Hyperlipidemia in an other family member; Hypertension in her mother; Stroke in her paternal grandmother. Social History:  Patient  reports that she has never smoked. She has never used smokeless tobacco. She reports current alcohol use. She reports that she does not use drugs.  Review of Systems: Constitutional: Negative for fever malaise or anorexia Cardiovascular: negative for chest pain Respiratory: negative for SOB or persistent cough Gastrointestinal: negative for abdominal pain  Objective  Vitals: BP 116/80   Pulse 88   Temp 98.5 F (36.9 C) (Temporal)   Resp 15   Ht 5\' 4"  (1.626 m)   Wt 139 lb (63 kg)   LMP 10/15/2016 (Approximate)   SpO2 99%   BMI 23.86 kg/m  General: no acute distress , A&Ox3 + phalens on left. Nl grip. No atrophy  Lab Results  Component Value Date   HGBA1C 4.9 07/30/2019   HGBA1C 5.0  08/13/2017       Commons side effects, risks, benefits, and alternatives for medications and treatment plan prescribed today were discussed, and the patient expressed understanding of the given instructions. Patient is instructed to call or message via MyChart if he/she has any questions or concerns regarding our treatment plan. No barriers to understanding were identified. We discussed Red Flag symptoms and signs in detail. Patient expressed understanding regarding what to do in  case of urgent or emergency type symptoms.   Medication list was reconciled, printed and provided to the patient in AVS. Patient instructions and summary information was reviewed with the patient as documented in the AVS. This note was prepared with assistance of Dragon voice recognition software. Occasional wrong-word or sound-a-like substitutions may have occurred due to the inherent limitations of voice recognition software  This visit occurred during the SARS-CoV-2 public health emergency.  Safety protocols were in place, including screening questions prior to the visit, additional usage of staff PPE, and extensive cleaning of exam room while observing appropriate contact time as indicated for disinfecting solutions.

## 2019-07-30 NOTE — Patient Instructions (Addendum)
Please follow up as scheduled for your next visit with me: 09/29/2019   Your sugar test today is 4.9; normal ranges from 4.0 - 5.9.  If you have any questions or concerns, please don't hesitate to send me a message via MyChart or call the office at [phone]. Thank you for visiting with [place] today! It's our pleasure caring for you.    Carpal Tunnel Syndrome  Carpal tunnel syndrome is a condition that causes pain in your hand and arm. The carpal tunnel is a narrow area located on the palm side of your wrist. Repeated wrist motion or certain diseases may cause swelling within the tunnel. This swelling pinches the main nerve in the wrist (median nerve). What are the causes? This condition may be caused by:  Repeated wrist motions.  Wrist injuries.  Arthritis.  A cyst or tumor in the carpal tunnel.  Fluid buildup during pregnancy. Sometimes the cause of this condition is not known. What increases the risk? The following factors may make you more likely to develop this condition:  Having a job, such as being a Research scientist (life sciences), that requires you to repeatedly move your wrist in the same motion.  Being a woman.  Having certain conditions, such as: ? Diabetes. ? Obesity. ? An underactive thyroid (hypothyroidism). ? Kidney failure. What are the signs or symptoms? Symptoms of this condition include:  A tingling feeling in your fingers, especially in your thumb, index, and middle fingers.  Tingling or numbness in your hand.  An aching feeling in your entire arm, especially when your wrist and elbow are bent for a long time.  Wrist pain that goes up your arm to your shoulder.  Pain that goes down into your palm or fingers.  A weak feeling in your hands. You may have trouble grabbing and holding items. Your symptoms may feel worse during the night. How is this diagnosed? This condition is diagnosed with a medical history and physical exam. You may also have tests,  including:  Electromyogram (EMG). This test measures electrical signals sent by your nerves into the muscles.  Nerve conduction study. This test measures how well electrical signals pass through your nerves.  Imaging tests, such as X-rays, ultrasound, and MRI. These tests check for possible causes of your condition. How is this treated? This condition may be treated with:  Lifestyle changes. It is important to stop or change the activity that caused your condition.  Doing exercise and activities to strengthen your muscles and bones (physical therapy).  Learning how to use your hand again after diagnosis (occupational therapy).  Medicines for pain and inflammation. This may include medicine that is injected into your wrist.  A wrist splint.  Surgery. Follow these instructions at home: If you have a splint:  Wear the splint as told by your health care provider. Remove it only as told by your health care provider.  Loosen the splint if your fingers tingle, become numb, or turn cold and blue.  Keep the splint clean.  If the splint is not waterproof: ? Do not let it get wet. ? Cover it with a watertight covering when you take a bath or shower. Managing pain, stiffness, and swelling   If directed, put ice on the painful area: ? If you have a removable splint, remove it as told by your health care provider. ? Put ice in a plastic bag. ? Place a towel between your skin and the bag. ? Leave the ice on for 20  minutes, 2-3 times per day. General instructions  Take over-the-counter and prescription medicines only as told by your health care provider.  Rest your wrist from any activity that may be causing your pain. If your condition is work related, talk with your employer about changes that can be made, such as getting a wrist pad to use while typing.  Do any exercises as told by your health care provider, physical therapist, or occupational therapist.  Keep all follow-up visits  as told by your health care provider. This is important. Contact a health care provider if:  You have new symptoms.  Your pain is not controlled with medicines.  Your symptoms get worse. Get help right away if:  You have severe numbness or tingling in your wrist or hand. Summary  Carpal tunnel syndrome is a condition that causes pain in your hand and arm.  It is usually caused by repeated wrist motions.  Lifestyle changes and medicines are used to treat carpal tunnel syndrome. Surgery may be recommended.  Follow your health care provider's instructions about wearing a splint, resting from activity, keeping follow-up visits, and calling for help. This information is not intended to replace advice given to you by your health care provider. Make sure you discuss any questions you have with your health care provider. Document Revised: 08/01/2017 Document Reviewed: 08/01/2017 Elsevier Patient Education  [place].

## 2019-08-20 ENCOUNTER — Other Ambulatory Visit: Payer: Self-pay

## 2019-08-23 ENCOUNTER — Ambulatory Visit (INDEPENDENT_AMBULATORY_CARE_PROVIDER_SITE_OTHER): Payer: 59 | Admitting: Obstetrics & Gynecology

## 2019-08-23 ENCOUNTER — Other Ambulatory Visit: Payer: Self-pay

## 2019-08-23 ENCOUNTER — Encounter: Payer: Self-pay | Admitting: Obstetrics & Gynecology

## 2019-08-23 VITALS — BP 130/70 | Ht 64.0 in | Wt 138.0 lb

## 2019-08-23 DIAGNOSIS — Z90710 Acquired absence of both cervix and uterus: Secondary | ICD-10-CM | POA: Diagnosis not present

## 2019-08-23 DIAGNOSIS — Z01419 Encounter for gynecological examination (general) (routine) without abnormal findings: Secondary | ICD-10-CM | POA: Diagnosis not present

## 2019-08-23 NOTE — Progress Notes (Signed)
Shannon Lawrence 1980-06-23 [id number]   History:    39 y.o. [id number] Married.  Sons 76 and 74 yo.  RP:  Established patient presenting for annual gyn exam   HPI: TAH/Bilateral Salpingectomy for Fibroids 10/2016.  Patho benign.  No pelvic pain.  No pain with IC.  Breasts normal.  Urine/BMs normal.  BMI 23.69.  Heath labs with Fam MD. right sinus surgery scheduled June 2021.   Past medical history,surgical history, family history and social history were all reviewed and documented in the EPIC chart.  Gynecologic History Patient's last menstrual period was 10/15/2016 (approximate).  Obstetric History OB History  Gravida Para Term Preterm AB Living  4 2 2   2 2   SAB TAB Ectopic Multiple Live Births  2       1    # Outcome Date GA Lbr Len/2nd Weight Sex Delivery Anes PTL Lv  4 Term 12/19/11 [redacted]w[redacted]d 02:19 / 00:10 6 lb 10.9 oz (3.03 kg) M Vag-Spont None  LIV  3 SAB 09/18/10          2 SAB 04/19/10          1 Term 04/20/07             ROS: A ROS was performed and pertinent positives and negatives are included in the history.  GENERAL: No fevers or chills. HEENT: No change in vision, no earache, sore throat or sinus congestion. NECK: No pain or stiffness. CARDIOVASCULAR: No chest pain or pressure. No palpitations. PULMONARY: No shortness of breath, cough or wheeze. GASTROINTESTINAL: No abdominal pain, nausea, vomiting or diarrhea, melena or bright red blood per rectum. GENITOURINARY: No urinary frequency, urgency, hesitancy or dysuria. MUSCULOSKELETAL: No joint or muscle pain, no back pain, no recent trauma. DERMATOLOGIC: No rash, no itching, no lesions. ENDOCRINE: No polyuria, polydipsia, no heat or cold intolerance. No recent change in weight. HEMATOLOGICAL: No anemia or easy bruising or bleeding. NEUROLOGIC: No headache, seizures, numbness, tingling or weakness. PSYCHIATRIC: No depression, no loss of interest in normal activity or change in sleep pattern.     Exam:   BP 130/70    Ht 5\' 4"  (1.626 m)   Wt 138 lb (62.6 kg)   LMP 10/15/2016 (Approximate)   BMI 23.69 kg/m   Body mass index is 23.69 kg/m.  General appearance : Well developed well nourished female. No acute distress HEENT: Eyes: no retinal hemorrhage or exudates,  Neck supple, trachea midline, no carotid bruits, no thyroidmegaly Lungs: Clear to auscultation, no rhonchi or wheezes, or rib retractions  Heart: Regular rate and rhythm, no murmurs or gallops Breast:Examined in sitting and supine position were symmetrical in appearance, no palpable masses or tenderness,  no skin retraction, no nipple inversion, no nipple discharge, no skin discoloration, no axillary or supraclavicular lymphadenopathy Abdomen: no palpable masses or tenderness, no rebound or guarding Extremities: no edema or skin discoloration or tenderness  Pelvic: Vulva: Normal             Vagina: No gross lesions or discharge  Cervix/Uterus absent  Adnexa  Without masses or tenderness  Anus: Normal   Assessment/Plan:  39 y.o. female for annual exam   1. Well female exam with routine gynecological exam Gynecologic exam status post total hysterectomy.  No indication to repeat a Pap test this year.  Breast exam normal.  Will schedule a screening mammogram at the breast center.  Good body mass index at 23.69.  Will resume regular physical activities after sinus surgery in  June.  Health labs with family physician.  2. S/P total hysterectomy  Other orders - cetirizine (ZYRTEC) 10 MG tablet; Take 10 mg by mouth daily.  [name] MD, 3:05 PM 08/23/2019

## 2019-08-23 NOTE — Patient Instructions (Signed)
1. Well female exam with routine gynecological exam Gynecologic exam status post total hysterectomy.  No indication to repeat a Pap test this year.  Breast exam normal.  Will schedule a screening mammogram at the breast center.  Good body mass index at 23.69.  Will resume regular physical activities after sinus surgery in June.  Health labs with family physician.  2. S/P total hysterectomy  Other orders - cetirizine (ZYRTEC) 10 MG tablet; Take 10 mg by mouth daily.  Shannon Lawrence, it was a pleasure seeing you today!

## 2019-09-29 ENCOUNTER — Encounter: Payer: 59 | Admitting: Family Medicine

## 2019-10-01 HISTORY — PX: NASAL SINUS SURGERY: SHX719

## 2019-11-17 ENCOUNTER — Encounter: Payer: 59 | Admitting: Family Medicine

## 2019-12-31 ENCOUNTER — Other Ambulatory Visit: Payer: Self-pay

## 2019-12-31 ENCOUNTER — Encounter: Payer: Self-pay | Admitting: Family Medicine

## 2019-12-31 ENCOUNTER — Ambulatory Visit: Payer: 59 | Admitting: Family Medicine

## 2019-12-31 VITALS — BP 116/80 | HR 94 | Temp 98.6°F | Resp 16 | Ht 64.0 in | Wt 137.6 lb

## 2019-12-31 DIAGNOSIS — Z0000 Encounter for general adult medical examination without abnormal findings: Secondary | ICD-10-CM | POA: Diagnosis not present

## 2019-12-31 DIAGNOSIS — R42 Dizziness and giddiness: Secondary | ICD-10-CM | POA: Diagnosis not present

## 2019-12-31 DIAGNOSIS — M79661 Pain in right lower leg: Secondary | ICD-10-CM

## 2019-12-31 DIAGNOSIS — J309 Allergic rhinitis, unspecified: Secondary | ICD-10-CM | POA: Diagnosis not present

## 2019-12-31 NOTE — Progress Notes (Signed)
Subjective  Chief Complaint  Patient presents with   Transitions Of Care   Dizziness   Leg Pain    right calf x3 weeks   Annual Exam    non-fasting    HPI: Shannon Lawrence is a 39 y.o. female who presents to [facility] today for a Female Wellness Visit.  She also has the concerns and/or needs as listed above in the chief complaint. These will be addressed in addition to the Health Maintenance Visit.   Wellness Visit: annual visit with health maintenance review and exam without Pap   HM: pt sees GYN for female wellness. Doing fine. Declines flu vaccine. Healthy lifestyle; little exericse Chronic disease management visit and/or acute problem visit:  Dizziness: Patient had sinus surgery back in June.  Dizziness and sinus symptoms are much improved.  She occasionally has congestion, intermittent vertiginous symptoms.  Nothing like before.  She had 3 weeks of a.m. calf soreness on the right.  Notices when she awakens in after she gets up and walks around already gets better.  No injury, spasm, knots, swelling, pain with walking.  She does not exercise.  He has not needed any medication for it.  Assessment  1. Annual physical exam   2. Chronic allergic rhinitis   3. Dizziness   4. Right calf pain      Plan  Female Wellness Visit:  Age appropriate Health Maintenance and Prevention measures were discussed with patient. Included topics are cancer screening recommendations, ways to keep healthy (see AVS) including dietary and exercise recommendations, regular eye and dental care, use of seat belts, and avoidance of moderate alcohol use and tobacco use.  Screens are up-to-date  BMI: discussed patient's BMI and encouraged positive lifestyle modifications to help get to or maintain a target BMI.  HM needs and immunizations were addressed and ordered. See below for orders. See HM and immunization section for updates.  Declines flu vaccine  Routine labs  and screening tests ordered including cmp, cbc and lipids where appropriate.  Discussed recommendations regarding Vit D and calcium supplementation (see AVS)  Chronic disease f/u and/or acute problem visit: (deemed necessary to be done in addition to the wellness visit):  Chronic allergic rhinitis and vertigo: Could be related.  Education given.  Monitor.  Reassured.  Symptoms sound peripheral in nature.  Start allergy medicines if nasal congestion becomes worse  Right calf pain: Possible muscle soreness or tightness.  Recommend stretching and massage.  Monitor.  Watch for swelling or worsening pain.  normal exam today Follow-up as needed  Follow up: Return in about 1 year (around 12/30/2020) for complete physical.   Orders Placed This Encounter  Procedures   CBC with Differential/Platelet   COMPLETE METABOLIC PANEL WITH GFR   Lipid panel   TSH   No orders of the defined types were placed in this encounter.     Lifestyle: Body mass index is 23.62 kg/m. Wt Readings from Last 3 Encounters:  12/31/19 137 lb 9.6 oz (62.4 kg)  08/23/19 138 lb (62.6 kg)  07/30/19 139 lb (63 kg)   Diet: general Exercise: rarely,  Need for contraception:  hysterectomy Patient Active Problem List   Diagnosis Date Noted   Chronic ethmoidal sinusitis 07/23/2019   Chronic maxillary sinusitis 07/23/2019   Chronic allergic rhinitis 07/15/2019   Tinea corporis 05/09/2016    Trial of topical treatment.    Health Maintenance  Topic Date Due   Hepatitis C Screening  Never done  INFLUENZA VACCINE  01/30/2020 (Originally 11/07/2019)   TETANUS/TDAP  08/15/2021   COVID-19 Vaccine  Completed   HIV Screening  Completed   Immunization History  Administered Date(s) Administered   PFIZER SARS-COV-2 Vaccination 09/04/2019, 09/25/2019   Tdap 08/16/2011   We updated and reviewed the patient's past history in detail and it is documented below. Allergies: Patient  reports current alcohol  use. Past Medical History Patient  has a past medical history of Cyst (solitary) of breast (left breast) and History of gestational diabetes. Past Surgical History Patient  has a past surgical history that includes Eye surgery; Robotic assisted total hysterectomy with salpingectomy (Bilateral, 11/05/2016); Abdominal hysterectomy (N/A, 11/05/2016); Bilateral salpingectomy (Bilateral, 11/05/2016); and Nasal sinus surgery (10/01/2019). Social History   Socioeconomic History   Marital status: Married    Spouse name: Not on file   Number of children: Not on file   Years of education: Not on file   Highest education level: Not on file  Occupational History    Employer: [employer]  Tobacco Use   Smoking status: Never Smoker   Smokeless tobacco: Never Used  Vaping Use   Vaping Use: Never used  Substance and Sexual Activity   Alcohol use: Yes    Comment: social   Drug use: No   Sexual activity: Yes    Partners: Male    Birth control/protection: None, Surgical  Other Topics Concern   Not on file  Social History Narrative   Not on file   Social Determinants of Health   Financial Resource Strain:    Difficulty of Paying Living Expenses: Not on file  Food Insecurity:    Worried About Charity fundraiser in the Last Year: Not on file   [employer] of Food in the Last Year: Not on file  Transportation Needs:    Lack of Transportation (Medical): Not on file   Lack of Transportation (Non-Medical): Not on file  Physical Activity:    Days of Exercise per Week: Not on file   Minutes of Exercise per Session: Not on file  Stress:    Feeling of Stress : Not on file  Social Connections:    Frequency of Communication with Friends and Family: Not on file   Frequency of Social Gatherings with Friends and Family: Not on file   Attends Religious Services: Not on file   Active Member of Clubs or Organizations: Not on file   Attends Archivist Meetings:  Not on file   Marital Status: Not on file   Family History  Problem Relation Age of Onset   Brain cancer Maternal Grandmother    Cancer Other    COPD Other    Hyperlipidemia Other    Hypertension Mother    Cancer Maternal Grandfather        stomach   Stroke Paternal Grandmother    Diabetes Paternal Grandfather     Review of Systems: Constitutional: negative for fever or malaise Ophthalmic: negative for photophobia, double vision or loss of vision Cardiovascular: negative for chest pain, dyspnea on exertion, or new LE swelling Respiratory: negative for SOB or persistent cough Gastrointestinal: negative for abdominal pain, change in bowel habits or melena Genitourinary: negative for dysuria or gross hematuria, no abnormal uterine bleeding or disharge Musculoskeletal: negative for new gait disturbance or muscular weakness Integumentary: negative for new or persistent rashes, no breast lumps Neurological: negative for TIA or stroke symptoms Psychiatric: negative for SI or delusions Allergic/Immunologic: negative for hives  Patient Care  Team    Relationship Specialty Notifications Start End  Leamon Arnt, MD PCP - General Family Medicine  07/15/19   [name], MD Consulting Physician Obstetrics and Gynecology  07/15/19   Melida Quitter, MD Consulting Physician Otolaryngology  12/31/19     Objective  Vitals: BP 116/80    Pulse 94    Temp 98.6 F (37 C) (Temporal)    Resp 16    Ht 5\' 4"  (1.626 m)    Wt 137 lb 9.6 oz (62.4 kg)    LMP 10/15/2016 (Approximate)    SpO2 99%    BMI 23.62 kg/m  General:  Well developed, well nourished, no acute distress  Psych:  Alert and orientedx3,normal mood and affect HEENT:  Normocephalic, atraumatic, non-icteric sclera, PERRL, supple neck without adenopathy, mass or thyromegaly Cardiovascular:  Normal S1, S2, RRR without gallop, rub or murmur Respiratory:  Good breath sounds bilaterally, CTAB with normal respiratory  effort Gastrointestinal: normal bowel sounds, soft, non-tender, no noted masses. No HSM MSK: no deformities, contusions. Joints are without erythema or swelling.  Right calf is normal-appearing, no cords palpated.  Negative Homans, no masses were not or tenderness present normal distal pulses Skin:  Warm, no rashes or suspicious lesions noted Neurologic:    Mental status is normal. Gross motor and sensory exams are normal. Normal gait. No tremor     Commons side effects, risks, benefits, and alternatives for medications and treatment plan prescribed today were discussed, and the patient expressed understanding of the given instructions. Patient is instructed to call or message via MyChart if he/she has any questions or concerns regarding our treatment plan. No barriers to understanding were identified. We discussed Red Flag symptoms and signs in detail. Patient expressed understanding regarding what to do in case of urgent or emergency type symptoms.   Medication list was reconciled, printed and provided to the patient in AVS. Patient instructions and summary information was reviewed with the patient as documented in the AVS. This note was prepared with assistance of Dragon voice recognition software. Occasional wrong-word or sound-a-like substitutions may have occurred due to the inherent limitations of voice recognition software  This visit occurred during the SARS-CoV-2 public health emergency.  Safety protocols were in place, including screening questions prior to the visit, additional usage of staff PPE, and extensive cleaning of exam room while observing appropriate contact time as indicated for disinfecting solutions.

## 2019-12-31 NOTE — Patient Instructions (Signed)
Please return in 12 months for your annual complete physical; please come fasting.  I will release your lab results to you on your MyChart account with further instructions. Please reply with any questions.   If you have any questions or concerns, please don't hesitate to send me a message via MyChart or call the office at [phone]. Thank you for visiting with us today! It's our pleasure caring for you.   Preventive Care 21-41 Years Old, Female Preventive care refers to visits with your health care provider and lifestyle choices that can promote health and wellness. This includes:  A yearly physical exam. This may also be called an annual well check.  Regular dental visits and eye exams.  Immunizations.  Screening for certain conditions.  Healthy lifestyle choices, such as eating a healthy diet, getting regular exercise, not using drugs or products that contain nicotine and tobacco, and limiting alcohol use. What can I expect for my preventive care visit? Physical exam Your health care provider will check your:  Height and weight. This may be used to calculate body mass index (BMI), which tells if you are at a healthy weight.  Heart rate and blood pressure.  Skin for abnormal spots. Counseling Your health care provider may ask you questions about your:  Alcohol, tobacco, and drug use.  Emotional well-being.  Home and relationship well-being.  Sexual activity.  Eating habits.  Work and work environment.  Method of birth control.  Menstrual cycle.  Pregnancy history. What immunizations do I need?  Influenza (flu) vaccine  This is recommended every year. Tetanus, diphtheria, and pertussis (Tdap) vaccine  You may need a Td booster every 10 years. Varicella (chickenpox) vaccine  You may need this if you have not been vaccinated. Human papillomavirus (HPV) vaccine  If recommended by your health care provider, you may need three doses over 6 months. Measles,  mumps, and rubella (MMR) vaccine  You may need at least one dose of MMR. You may also need a second dose. Meningococcal conjugate (MenACWY) vaccine  One dose is recommended if you are age 19-21 years and a first-year college student living in a residence hall, or if you have one of several medical conditions. You may also need additional booster doses. Pneumococcal conjugate (PCV13) vaccine  You may need this if you have certain conditions and were not previously vaccinated. Pneumococcal polysaccharide (PPSV23) vaccine  You may need one or two doses if you smoke cigarettes or if you have certain conditions. Hepatitis A vaccine  You may need this if you have certain conditions or if you travel or work in places where you may be exposed to hepatitis A. Hepatitis B vaccine  You may need this if you have certain conditions or if you travel or work in places where you may be exposed to hepatitis B. Haemophilus influenzae type b (Hib) vaccine  You may need this if you have certain conditions. You may receive vaccines as individual doses or as more than one vaccine together in one shot (combination vaccines). Talk with your health care provider about the risks and benefits of combination vaccines. What tests do I need?  Blood tests  Lipid and cholesterol levels. These may be checked every 5 years starting at age 20.  Hepatitis C test.  Hepatitis B test. Screening  Diabetes screening. This is done by checking your blood sugar (glucose) after you have not eaten for a while (fasting).  Sexually transmitted disease (STD) testing.  BRCA-related cancer screening. This may be   done if you have a family history of breast, ovarian, tubal, or peritoneal cancers.  Pelvic exam and Pap test. This may be done every 3 years starting at age 21. Starting at age 30, this may be done every 5 years if you have a Pap test in combination with an HPV test. Talk with your health care provider about your test  results, treatment options, and if necessary, the need for more tests. Follow these instructions at home: Eating and drinking   Eat a diet that includes fresh fruits and vegetables, whole grains, lean protein, and low-fat dairy.  Take vitamin and mineral supplements as recommended by your health care provider.  Do not drink alcohol if: ? Your health care provider tells you not to drink. ? You are pregnant, may be pregnant, or are planning to become pregnant.  If you drink alcohol: ? Limit how much you have to 0-1 drink a day. ? Be aware of how much alcohol is in your drink. In the [place] one drink equals one 12 oz bottle of beer (355 mL), one 5 oz glass of wine (148 mL), or one 1 oz glass of hard liquor (44 mL). Lifestyle  Take daily care of your teeth and gums.  Stay active. Exercise for at least 30 minutes on 5 or more days each week.  Do not use any products that contain nicotine or tobacco, such as cigarettes, e-cigarettes, and chewing tobacco. If you need help quitting, ask your health care provider.  If you are sexually active, practice safe sex. Use a condom or other form of birth control (contraception) in order to prevent pregnancy and STIs (sexually transmitted infections). If you plan to become pregnant, see your health care provider for a preconception visit. What's next?  Visit your health care provider once a year for a well check visit.  Ask your health care provider how often you should have your eyes and teeth checked.  Stay up to date on all vaccines. This information is not intended to replace advice given to you by your health care provider. Make sure you discuss any questions you have with your health care provider. Document Revised: 12/04/2017 Document Reviewed: 12/04/2017 Elsevier Patient Education  2020 [employer]   

## 2020-01-01 LAB — COMPLETE METABOLIC PANEL WITHOUT GFR
AG Ratio: 1.7 (calc) (ref 1.0–2.5)
ALT: 13 U/L (ref 6–29)
AST: 15 U/L (ref 10–30)
Albumin: 4.4 g/dL (ref 3.6–5.1)
Alkaline phosphatase (APISO): 52 U/L (ref 31–125)
BUN: 12 mg/dL (ref 7–25)
CO2: 25 mmol/L (ref 20–32)
Calcium: 9.5 mg/dL (ref 8.6–10.2)
Chloride: 103 mmol/L (ref 98–110)
Creat: 0.74 mg/dL (ref 0.50–1.10)
GFR, Est African American: 117 mL/min/1.73m2
GFR, Est Non African American: 101 mL/min/1.73m2
Globulin: 2.6 g/dL (ref 1.9–3.7)
Glucose, Bld: 78 mg/dL (ref 65–99)
Potassium: 3.8 mmol/L (ref 3.5–5.3)
Sodium: 138 mmol/L (ref 135–146)
Total Bilirubin: 0.7 mg/dL (ref 0.2–1.2)
Total Protein: 7 g/dL (ref 6.1–8.1)

## 2020-01-01 LAB — CBC WITH DIFFERENTIAL/PLATELET
Absolute Monocytes: 643 cells/uL (ref 200–950)
Basophils Absolute: 83 cells/uL (ref 0–200)
Basophils Relative: 0.7 %
Eosinophils Absolute: 417 cells/uL (ref 15–500)
Eosinophils Relative: 3.5 %
HCT: 41.3 % (ref 35.0–45.0)
Hemoglobin: 14.2 g/dL (ref 11.7–15.5)
Lymphs Abs: 2202 cells/uL (ref 850–3900)
MCH: 32.4 pg (ref 27.0–33.0)
MCHC: 34.4 g/dL (ref 32.0–36.0)
MCV: 94.3 fL (ref 80.0–100.0)
MPV: 9.8 fL (ref 7.5–12.5)
Monocytes Relative: 5.4 %
Neutro Abs: 8556 cells/uL — ABNORMAL HIGH (ref 1500–7800)
Neutrophils Relative %: 71.9 %
Platelets: 358 10*3/uL (ref 140–400)
RBC: 4.38 10*6/uL (ref 3.80–5.10)
RDW: 11.2 % (ref 11.0–15.0)
Total Lymphocyte: 18.5 %
WBC: 11.9 10*3/uL — ABNORMAL HIGH (ref 3.8–10.8)

## 2020-01-01 LAB — LIPID PANEL
Cholesterol: 141 mg/dL (ref <200)
HDL: 62 mg/dL (ref >=50)
LDL Cholesterol (Calc): 64 mg/dL (calc)
Non-HDL Cholesterol (Calc): 79 mg/dL (calc) (ref <130)
Total CHOL/HDL Ratio: 2.3 (calc) (ref <5.0)
Triglycerides: 66 mg/dL (ref <150)

## 2020-01-01 LAB — TSH: TSH: 0.78 m[IU]/L

## 2020-01-03 ENCOUNTER — Encounter: Payer: Self-pay | Admitting: Family Medicine

## 2020-01-03 DIAGNOSIS — J3489 Other specified disorders of nose and nasal sinuses: Secondary | ICD-10-CM

## 2020-01-04 MED ORDER — AMOXICILLIN-POT CLAVULANATE 600-42.9 MG/5ML PO SUSR: 7.5000 mL | Freq: Two times a day (BID) | ORAL | 0 refills | Status: AC

## 2020-01-04 MED ORDER — AMOXICILLIN-POT CLAVULANATE 875-125 MG PO TABS: 1.0000 | ORAL_TABLET | Freq: Two times a day (BID) | ORAL | 0 refills | Status: DC

## 2020-01-04 NOTE — Addendum Note (Signed)
Addended by: Billey Chang on: 01/04/2020 11:55 AM   Modules accepted: Orders

## 2020-01-11 NOTE — Telephone Encounter (Signed)
Please call her and start with doppler to r/o a clot. If negative, then will refer to sports medicine   Thanks!

## 2020-01-12 ENCOUNTER — Other Ambulatory Visit: Payer: Self-pay

## 2020-01-12 DIAGNOSIS — M79661 Pain in right lower leg: Secondary | ICD-10-CM

## 2020-01-14 ENCOUNTER — Telehealth: Payer: Self-pay

## 2020-01-14 NOTE — Telephone Encounter (Signed)
Gave patient number to [facility] they told her they did not have an order for her so I gave her number for [facility] and she called them and asked me to send message back just to verify

## 2020-01-14 NOTE — Telephone Encounter (Signed)
These are usually done at [facility] or [facility].

## 2020-01-14 NOTE — Telephone Encounter (Signed)
Patient is trying to call and get her VAS [place] LOWER EXTREMITY VENOUS (DVT) scheduled and would like to know what facility to call to schedule if we can verify location and give her phone please.

## 2020-01-14 NOTE — Telephone Encounter (Signed)
Where are these done?

## 2020-01-17 ENCOUNTER — Ambulatory Visit (HOSPITAL_COMMUNITY)
Admission: RE | Admit: 2020-01-17 | Discharge: 2020-01-17 | Disposition: A | Discharge status: Home / Self Care | Payer: 59 | Source: Ambulatory Visit | Attending: Family Medicine | Admitting: Family Medicine

## 2020-01-17 ENCOUNTER — Other Ambulatory Visit: Payer: Self-pay

## 2020-01-17 DIAGNOSIS — M79661 Pain in right lower leg: Secondary | ICD-10-CM | POA: Diagnosis present

## 2020-01-17 NOTE — Progress Notes (Signed)
Please call patient:neg doppler for DVT. If still painful, would like her to see sports medicine.

## 2020-01-27 ENCOUNTER — Other Ambulatory Visit: Payer: Self-pay | Admitting: Family Medicine

## 2020-01-27 DIAGNOSIS — Z1231 Encounter for screening mammogram for malignant neoplasm of breast: Secondary | ICD-10-CM

## 2020-02-07 ENCOUNTER — Other Ambulatory Visit: Payer: Self-pay

## 2020-02-07 ENCOUNTER — Ambulatory Visit
Admission: RE | Admit: 2020-02-07 | Discharge: 2020-02-07 | Disposition: A | Discharge status: Home / Self Care | Payer: 59 | Source: Ambulatory Visit | Attending: Family Medicine | Admitting: Family Medicine

## 2020-02-07 DIAGNOSIS — Z1231 Encounter for screening mammogram for malignant neoplasm of breast: Secondary | ICD-10-CM

## 2020-04-13 ENCOUNTER — Other Ambulatory Visit: Payer: Self-pay

## 2020-04-13 DIAGNOSIS — N951 Menopausal and female climacteric states: Secondary | ICD-10-CM

## 2020-04-21 ENCOUNTER — Other Ambulatory Visit: Payer: Self-pay

## 2020-04-21 ENCOUNTER — Other Ambulatory Visit: Payer: Self-pay | Admitting: Anesthesiology

## 2020-04-21 ENCOUNTER — Other Ambulatory Visit (INDEPENDENT_AMBULATORY_CARE_PROVIDER_SITE_OTHER): Payer: 59

## 2020-04-21 DIAGNOSIS — N951 Menopausal and female climacteric states: Secondary | ICD-10-CM

## 2020-04-22 LAB — FOLLICLE STIMULATING HORMONE: FSH: 8.8 m[IU]/mL

## 2020-05-03 ENCOUNTER — Telehealth (INDEPENDENT_AMBULATORY_CARE_PROVIDER_SITE_OTHER): Payer: 59 | Admitting: Physician Assistant

## 2020-05-03 ENCOUNTER — Encounter: Payer: Self-pay | Admitting: Physician Assistant

## 2020-05-03 VITALS — Temp 99.2°F | Ht 64.0 in | Wt 140.0 lb

## 2020-05-03 DIAGNOSIS — J3489 Other specified disorders of nose and nasal sinuses: Secondary | ICD-10-CM

## 2020-05-03 DIAGNOSIS — Z7189 Other specified counseling: Secondary | ICD-10-CM | POA: Diagnosis not present

## 2020-05-03 NOTE — Progress Notes (Signed)
Virtual Visit via Video   I connected with Shannon Lawrence on 05/03/20 at  2:30 PM EST by a video enabled telemedicine application and verified that I am speaking with the correct person using two identifiers. Location patient: Home Location provider: [facility], Office Persons participating in the virtual visit: Shannon Lawrence, Shannon Lawrence, Shannon Pickler, Shannon Lawrence   I discussed the limitations of evaluation and management by telemedicine and the availability of in person appointments. The patient expressed understanding and agreed to proceed.  I acted as a Education administrator for [employer], [employer], Shannon Lawrence   Subjective:   HPI:   Patient is requesting evaluation for possible COVID-19.  Symptom onset: started on 1/20, worsening in the past 2-3 days  Travel/contacts: no  Vaccination status: 2 shots  Testing results: Rapid home test done last night negative  Patient endorses the following symptoms: Headache, bilateral ear pain, sore throat, teeth hurt off and on, possible myalgias  Patient denies the following symptoms: Cough, wheezing, chest tightness  Treatments tried: Ibuprofen  Patient risk factors: Current LKGMW-10 risk of complications score: 0 Smoking status: Shannon Lawrence  reports that she has never smoked. She has never used smokeless tobacco. If female, currently pregnant? []   Yes [x]   No  ROS: See pertinent positives and negatives per HPI.  Patient Active Problem List   Diagnosis Date Noted  . Chronic ethmoidal sinusitis 07/23/2019  . Chronic maxillary sinusitis 07/23/2019  . Chronic allergic rhinitis 07/15/2019  . Tinea corporis 05/09/2016    Social History   Tobacco Use  . Smoking status: Never Smoker  . Smokeless tobacco: Never Used  Substance Use Topics  . Alcohol use: Yes    Comment: social   No current outpatient medications on file.  Allergies  Allergen Reactions  . Sulfa Drugs Cross Reactors Other (See Comments)     Reaction unknown    Objective:   VITALS: Per patient if applicable, see vitals. GENERAL: Alert, appears well and in no acute distress. HEENT: Atraumatic, conjunctiva clear, no obvious abnormalities on inspection of external nose and ears. NECK: Normal movements of the head and neck. CARDIOPULMONARY: No increased WOB. Speaking in clear sentences. I:E ratio WNL.  MS: Moves all visible extremities without noticeable abnormality. PSYCH: Pleasant and cooperative, well-groomed. Speech normal rate and rhythm. Affect is appropriate. Insight and judgement are appropriate. Attention is focused, linear, and appropriate.  NEURO: CN grossly intact. Oriented as arrived to appointment on time with no prompting. Moves both UE equally.  SKIN: No obvious lesions, wounds, erythema, or cyanosis noted on face or hands.  Assessment and Plan:   Shannon Lawrence was seen today for covid symptoms.  Diagnoses and all orders for this visit:  Sinus pain  Advice given about COVID-19 virus infection   No red flags on discussion, patient is not in any obvious distress during our visit. Discussed progression of most viral illnesses, and recommended supportive care at this point in time.  She is going to come for scheduled testing tomorrow afternoon at our office. Should her test results be negative and sinus-symptoms persist, we will do liquid augmentin per her request.  Discussed over the counter supportive care options, including Tylenol 500 mg q 8 hours, with recommendations to push fluids and rest. Reviewed return precautions including new/worsening fever, SOB, new/worsening cough, sudden onset changes of symptoms. Recommended need to self-quarantine and practice social distancing until symptoms resolve. I recommend that patient follow-up if symptoms worsen or persist despite treatment x 7-10  days, sooner if needed.  I discussed the assessment and treatment plan with the patient. The patient was provided an  opportunity to ask questions and all were answered. The patient agreed with the plan and demonstrated an understanding of the instructions.   The patient was advised to call back or seek an in-person evaluation if the symptoms worsen or if the condition fails to improve as anticipated.   CMA or Shannon Lawrence served as scribe during this visit. History, Physical, and Plan performed by medical provider. The above documentation has been reviewed and is accurate and complete.  [place], [place] 05/03/2020

## 2020-05-04 ENCOUNTER — Encounter: Payer: Self-pay | Admitting: Physician Assistant

## 2020-05-04 ENCOUNTER — Other Ambulatory Visit: Payer: 59

## 2020-05-04 DIAGNOSIS — Z20822 Contact with and (suspected) exposure to covid-19: Secondary | ICD-10-CM

## 2020-05-04 NOTE — Telephone Encounter (Signed)
Pt is following up on this 

## 2020-05-05 ENCOUNTER — Other Ambulatory Visit: Payer: Self-pay | Admitting: Physician Assistant

## 2020-05-05 LAB — NOVEL CORONAVIRUS, NAA: SARS-CoV-2, NAA: DETECTED — AB

## 2020-05-05 LAB — SARS-COV-2, NAA 2 DAY TAT

## 2020-05-05 MED ORDER — AMOXICILLIN-POT CLAVULANATE 600-42.9 MG/5ML PO SUSR: 875.0000 mg | Freq: Two times a day (BID) | ORAL | 0 refills | Status: AC

## 2020-05-10 ENCOUNTER — Telehealth: Payer: Self-pay

## 2020-05-10 NOTE — Telephone Encounter (Signed)
Patient called in stating that she is COVID positive, and is past her quarantine time but is still having symptoms. She is wondering when the right time would be to go back to work. Offered virtual visit but patient declined.

## 2020-05-10 NOTE — Telephone Encounter (Signed)
Patient given recommendations by PCP listed below

## 2020-05-10 NOTE — Telephone Encounter (Signed)
At this time, returning to work just depends upon how she feels and if she is able to perform her job.  She is out of the window needed for isolating.  Dr. Jonni Sanger

## 2020-05-10 NOTE — Telephone Encounter (Signed)
Please advise 

## 2020-05-22 ENCOUNTER — Encounter: Payer: Self-pay | Admitting: Family Medicine

## 2020-05-26 MED ORDER — AMOXICILLIN-POT CLAVULANATE 600-42.9 MG/5ML PO SUSR: 7.5000 mL | Freq: Two times a day (BID) | ORAL | 0 refills | Status: DC

## 2020-08-23 ENCOUNTER — Other Ambulatory Visit: Payer: Self-pay

## 2020-08-23 ENCOUNTER — Ambulatory Visit (INDEPENDENT_AMBULATORY_CARE_PROVIDER_SITE_OTHER): Payer: 59 | Admitting: Obstetrics & Gynecology

## 2020-08-23 ENCOUNTER — Encounter: Payer: Self-pay | Admitting: Obstetrics & Gynecology

## 2020-08-23 VITALS — BP 130/72 | HR 77 | Ht 63.0 in | Wt 143.0 lb

## 2020-08-23 DIAGNOSIS — Z90710 Acquired absence of both cervix and uterus: Secondary | ICD-10-CM | POA: Diagnosis not present

## 2020-08-23 DIAGNOSIS — Z01419 Encounter for gynecological examination (general) (routine) without abnormal findings: Secondary | ICD-10-CM | POA: Diagnosis not present

## 2020-08-23 NOTE — Progress Notes (Addendum)
Shannon Lawrence 1981-02-18 [id number]   History:    40 y.o. G4P2A2L2 Married. Sons 71 and 89 yo.  BD:[id number] presenting for annual gyn exam   HPI:TAH/Bilateral Salpingectomy for Fibroids 10/2016. Patho benign. No pelvic pain.  No pain with IC. Breasts normal. Urine/BMs normal. BMI 25.33.  More anxiety, managing it well.  [facility] 04/2020 was normal at 8.8. Heath labs with Fam MD. Right sinus surgery scheduled June 2021.  Past medical history,surgical history, family history and social history were all reviewed and documented in the EPIC chart.  Gynecologic History Patient's last menstrual period was 10/15/2016 (approximate).  Obstetric History OB History  Gravida Para Term Preterm AB Living  4 2 2   2 2   SAB IAB Ectopic Multiple Live Births  2       1    # Outcome Date GA Lbr Len/2nd Weight Sex Delivery Anes PTL Lv  4 Term 12/19/11 101w5d 02:19 / 00:10 6 lb 10.9 oz (3.03 kg) M Vag-Spont None  LIV  3 SAB 09/18/10          2 SAB 04/19/10          1 Term 04/20/07             ROS: A ROS was performed and pertinent positives and negatives are included in the history.  GENERAL: No fevers or chills. HEENT: No change in vision, no earache, sore throat or sinus congestion. NECK: No pain or stiffness. CARDIOVASCULAR: No chest pain or pressure. No palpitations. PULMONARY: No shortness of breath, cough or wheeze. GASTROINTESTINAL: No abdominal pain, nausea, vomiting or diarrhea, melena or bright red blood per rectum. GENITOURINARY: No urinary frequency, urgency, hesitancy or dysuria. MUSCULOSKELETAL: No joint or muscle pain, no back pain, no recent trauma. DERMATOLOGIC: No rash, no itching, no lesions. ENDOCRINE: No polyuria, polydipsia, no heat or cold intolerance. No recent change in weight. HEMATOLOGICAL: No anemia or easy bruising or bleeding. NEUROLOGIC: No headache, seizures, numbness, tingling or weakness. PSYCHIATRIC: No depression, no loss of interest in normal  activity or change in sleep pattern.     Exam:   BP 130/72   Pulse 77   Ht 5\' 3"  (1.6 m)   Wt 143 lb (64.9 kg)   LMP 10/15/2016 (Approximate)   SpO2 100%   BMI 25.33 kg/m   Body mass index is 25.33 kg/m.  General appearance : Well developed well nourished female. No acute distress HEENT: Eyes: no retinal hemorrhage or exudates,  Neck supple, trachea midline, no carotid bruits, no thyroidmegaly Lungs: Clear to auscultation, no rhonchi or wheezes, or rib retractions  Heart: Regular rate and rhythm, no murmurs or gallops Breast:Examined in sitting and supine position were symmetrical in appearance, no palpable masses or tenderness,  no skin retraction, no nipple inversion, no nipple discharge, no skin discoloration, no axillary or supraclavicular lymphadenopathy Abdomen: no palpable masses or tenderness, no rebound or guarding Extremities: no edema or skin discoloration or tenderness  Pelvic: Vulva: Normal             Vagina: No gross lesions or discharge  Cervix/Uterus absent  Adnexa  Without masses or tenderness  Anus: Normal   Assessment/Plan:  40 y.o. female for annual exam   1. Well female exam with routine gynecological exam Gynecologic exam status post TAH with bilateral salpingectomy for fibroids in July 2018.  Pathology was benign.  We will repeat a Pap test at 5 years.  Breast exam normal.  Screening mammogram November 2021 was negative.  Health labs with family physician.  Body mass index 25.33.  Physically active with healthy nutrition.  2. S/P total hysterectomy  [name] MD, 3:37 PM 08/23/2020

## 2020-09-11 ENCOUNTER — Encounter: Payer: Self-pay | Admitting: Family Medicine

## 2020-09-12 MED ORDER — AMOXICILLIN-POT CLAVULANATE 600-42.9 MG/5ML PO SUSR: 7.5000 mL | Freq: Two times a day (BID) | ORAL | 0 refills | Status: AC

## 2020-12-22 ENCOUNTER — Encounter: Payer: Self-pay | Admitting: Family Medicine

## 2021-01-16 ENCOUNTER — Encounter: Payer: Self-pay | Admitting: Family Medicine

## 2021-04-17 ENCOUNTER — Other Ambulatory Visit: Payer: Self-pay | Admitting: Obstetrics & Gynecology

## 2021-04-17 ENCOUNTER — Other Ambulatory Visit: Payer: Self-pay | Admitting: Family Medicine

## 2021-04-17 DIAGNOSIS — Z1231 Encounter for screening mammogram for malignant neoplasm of breast: Secondary | ICD-10-CM

## 2021-05-03 ENCOUNTER — Ambulatory Visit
Admission: RE | Admit: 2021-05-03 | Discharge: 2021-05-03 | Disposition: A | Discharge status: Home / Self Care | Payer: 59 | Source: Ambulatory Visit | Attending: Obstetrics & Gynecology | Admitting: Obstetrics & Gynecology

## 2021-05-03 ENCOUNTER — Other Ambulatory Visit: Payer: Self-pay

## 2021-05-03 DIAGNOSIS — Z1231 Encounter for screening mammogram for malignant neoplasm of breast: Secondary | ICD-10-CM

## 2021-08-24 ENCOUNTER — Ambulatory Visit (INDEPENDENT_AMBULATORY_CARE_PROVIDER_SITE_OTHER): Payer: 59 | Admitting: Obstetrics & Gynecology

## 2021-08-24 ENCOUNTER — Encounter: Payer: Self-pay | Admitting: Obstetrics & Gynecology

## 2021-08-24 VITALS — BP 104/70 | HR 79 | Resp 16 | Ht 63.75 in | Wt 149.0 lb

## 2021-08-24 DIAGNOSIS — Z90710 Acquired absence of both cervix and uterus: Secondary | ICD-10-CM

## 2021-08-24 DIAGNOSIS — Z01419 Encounter for gynecological examination (general) (routine) without abnormal findings: Secondary | ICD-10-CM | POA: Diagnosis not present

## 2021-08-24 NOTE — Progress Notes (Signed)
Shannon Lawrence 1981/02/09 [id number]   History:    41 y.o.  G4P2A2L2 Married.  Sons 72 and 59 yo.   RP:  Established patient presenting for annual gyn exam    HPI: TAH/Bilateral Salpingectomy for Fibroids 10/2016.  Patho benign.  No pelvic pain.  No pain with IC.  Breasts normal. Mammo Neg 04/2021.  Urine/BMs normal.  BMI 25.78. Good fitness with Peloton and Pilates. [facility] 04/2020 was normal at 8.8.  [facility]ath labs all normal 12/2019.  Will repeat Health labs next year.    Past medical history,surgical history, family history and social history were all reviewed and documented in the EPIC chart.  Gynecologic History Patient's last menstrual period was 10/15/2016 (approximate).  Obstetric History OB History  Gravida Para Term Preterm AB Living  '4 2 2   2 2  '$ SAB IAB Ectopic Multiple Live Births  2       2    # Outcome Date GA Lbr Len/2nd Weight Sex Delivery Anes PTL Lv  4 Term 12/19/11 24w5d02:19 / 00:10 6 lb 10.9 oz (3.03 kg) M Vag-Spont None  LIV  3 SAB 09/18/10          2 SAB 04/19/10          1 Term 04/20/07             ROS: A ROS was performed and pertinent positives and negatives are included in the history. GENERAL: No fevers or chills. HEENT: No change in vision, no earache, sore throat or sinus congestion. NECK: No pain or stiffness. CARDIOVASCULAR: No chest pain or pressure. No palpitations. PULMONARY: No shortness of breath, cough or wheeze. GASTROINTESTINAL: No abdominal pain, nausea, vomiting or diarrhea, melena or bright red blood per rectum. GENITOURINARY: No urinary frequency, urgency, hesitancy or dysuria. MUSCULOSKELETAL: No joint or muscle pain, no back pain, no recent trauma. DERMATOLOGIC: No rash, no itching, no lesions. ENDOCRINE: No polyuria, polydipsia, no heat or cold intolerance. No recent change in weight. HEMATOLOGICAL: No anemia or easy bruising or bleeding. NEUROLOGIC: No headache, seizures, numbness, tingling or weakness. PSYCHIATRIC: No depression, no loss of  interest in normal activity or change in sleep pattern.     Exam:   BP 104/70   Pulse 79   Resp 16   Ht 5' 3.75" (1.619 m)   Wt 149 lb (67.6 kg)   LMP 10/15/2016 (Approximate)   BMI 25.78 kg/m   Body mass index is 25.78 kg/m.  General appearance : Well developed well nourished female. No acute distress HEENT: Eyes: no retinal hemorrhage or exudates,  Neck supple, trachea midline, no carotid bruits, no thyroidmegaly Lungs: Clear to auscultation, no rhonchi or wheezes, or rib retractions  Heart: Regular rate and rhythm, no murmurs or gallops Breast:Examined in sitting and supine position were symmetrical in appearance, no palpable masses or tenderness,  no skin retraction, no nipple inversion, no nipple discharge, no skin discoloration, no axillary or supraclavicular lymphadenopathy Abdomen: no palpable masses or tenderness, no rebound or guarding Extremities: no edema or skin discoloration or tenderness  Pelvic: Vulva: Normal             Vagina: No gross lesions or discharge  Cervix/Uterus absent  Adnexa  Without masses or tenderness  Anus: Normal   Assessment/Plan:  41y.o. female for annual exam   1. Well female exam with routine gynecological exam TAH/Bilateral Salpingectomy for Fibroids 10/2016.  Patho benign.  No pelvic pain.  No pain with IC.  Breasts normal.  Mammo Neg 04/2021.  Urine/BMs normal.  BMI 25.78. Good fitness with Peloton and Pilates. [facility] 04/2020 was normal at 8.8.  Heath labs all normal 12/2019.  Will repeat Health labs next year.   2. S/P total hysterectomy   [name] MD, 3:52 PM 08/24/2021

## 2021-11-07 ENCOUNTER — Encounter: Payer: Self-pay | Admitting: Family Medicine

## 2021-11-08 ENCOUNTER — Encounter: Payer: Self-pay | Admitting: Family Medicine

## 2021-11-08 ENCOUNTER — Ambulatory Visit: Payer: 59 | Admitting: Family Medicine

## 2021-11-08 VITALS — BP 130/76 | HR 83 | Temp 98.6°F | Ht 63.75 in | Wt 153.5 lb

## 2021-11-08 DIAGNOSIS — M5431 Sciatica, right side: Secondary | ICD-10-CM

## 2021-11-08 LAB — POCT URINALYSIS DIPSTICK
Bilirubin, UA: NEGATIVE
Blood, UA: NEGATIVE
Glucose, UA: NEGATIVE
Ketones, UA: NEGATIVE
Leukocytes, UA: NEGATIVE
Nitrite, UA: NEGATIVE
Protein, UA: NEGATIVE
Spec Grav, UA: <=1.005 — AB (ref 1.010–1.025)
Urobilinogen, UA: 0.2 E.U./dL
pH, UA: 6.5 (ref 5.0–8.0)

## 2021-11-08 NOTE — Patient Instructions (Signed)
Advil 3 tabs every 6 hrs as needed.  Tylenol as needed.

## 2021-11-08 NOTE — Progress Notes (Signed)
Subjective:     Patient ID: Shannon Lawrence, female    DOB: Jun 24, 1980, 41 y.o.   MRN: [id number]  Chief Complaint  Patient presents with   Back Pain    Lower back pain that started Saturday that radiates to the side and front of right thigh, some numbness and itching Took Tylenol at 7 am    HPI No injury.  Sees massage therapy monthly(for shoulder).  Had massage 1 wk ago. Has been riding bike for 1 month.  Started w/LBP since 7/29. Was mild, but then worse on 7/31 but more nausea and not "feel good".   Taking tylenol and advil.  Pain rad down side of R leg and rad to inner thigh.  If touch skin on ant thigh yesterday, some numbness/sens.   Dull back pain SI area constant.  Advil 2 tabs every 4-6 hrs  skin felt very "itchy" same area.  Better today.   No rash. No f/c.   No abd pain.  Some increase in BM.  No dysuria. Pt wonders if kidney stone  Partial hyst for fibroids.  Health Maintenance Due  Topic Date Due   Hepatitis C Screening  Never done    Past Medical History:  Diagnosis Date   Cyst (solitary) of breast left breast   patient denies this is a part of her medical history 10/28/16   History of gestational diabetes     Past Surgical History:  Procedure Laterality Date   ABDOMINAL HYSTERECTOMY N/A 11/05/2016   Procedure: TOTAL ABDOMINAL HYSTERECTOMY;  Surgeon: [name], MD;  Location: [facility];  Service: Gynecology;  Laterality: N/A;   BILATERAL SALPINGECTOMY Bilateral 11/05/2016   Procedure: BILATERAL SALPINGECTOMY;  Surgeon: [name], MD;  Location: [facility];  Service: Gynecology;  Laterality: Bilateral;   EYE SURGERY     2000 left eye   NASAL SINUS SURGERY  10/01/2019   ROBOTIC ASSISTED TOTAL HYSTERECTOMY WITH SALPINGECTOMY Bilateral 11/05/2016   Procedure: ATTEMPTED ROBOTIC ASSISTED TOTAL HYSTERECTOMY WITH BILATERAL SALPINGECTOMY;  Surgeon: [name], MD;  Location: [facility];  Service: Gynecology;  Laterality: Bilateral;    No outpatient  medications prior to visit.   No facility-administered medications prior to visit.    Allergies  Allergen Reactions   Sulfa Drugs Cross Reactors Other (See Comments)    Reaction unknown   ROS neg/noncontributory except as noted HPI/below      Objective:     BP 130/76   Pulse 83   Temp 98.6 F (37 C) (Temporal)   Ht 5' 3.75" (1.619 m)   Wt 153 lb 8 oz (69.6 kg)   LMP 10/15/2016 (Approximate)   SpO2 99%   BMI 26.56 kg/m  Wt Readings from Last 3 Encounters:  11/08/21 153 lb 8 oz (69.6 kg)  08/24/21 149 lb (67.6 kg)  08/23/20 143 lb (64.9 kg)    Physical Exam   Gen: WDWN NAD HEENT: NCAT, conjunctiva not injected, sclera nonicteric ABDOMEN:  BS+, soft, NTND, No HSM, no masses. No cvat EXT:  no edema MSK: no gross abnormalities.  Back:  can stand on heels/toes/1 leg.  No TTP. No SI tenderness. No rash.  MS 5/5 BLE.  DTR 2+ BLE.  SLR neg B.  Good ROM  NEURO: A&O x3.  CN II-XII intact.  PSYCH: normal mood. Good eye contact  Results for orders placed or performed in visit on 11/08/21  POCT urinalysis dipstick  Result Value Ref Range   Color, UA YELLOW    Clarity, UA CLEAR  Glucose, UA Negative Negative   Bilirubin, UA NEG    Ketones, UA NEG    Spec Grav, UA <=1.005 (A) 1.010 - 1.025   Blood, UA NEG    pH, UA 6.5 5.0 - 8.0   Protein, UA Negative Negative   Urobilinogen, UA 0.2 0.2 or 1.0 E.U./dL   Nitrite, UA NEG    Leukocytes, UA Negative Negative   Appearance     Odor           Assessment & Plan:   Problem List Items Addressed This Visit   None Visit Diagnoses     Right sided sciatica    -  Primary   Relevant Orders   POCT urinalysis dipstick      R sciatica or other nerve impingement.  Will check UA to r/o stone.  Discussed options/course w/pt.   Will inc advil to '600mg'$  q 6h and tylenol if needed.  Stretches.  Worse, new symptoms or not better, consider PT.    No orders of the defined types were placed in this encounter.   Wellington Hampshire, MD

## 2021-11-09 ENCOUNTER — Ambulatory Visit: Payer: 59 | Admitting: Family Medicine

## 2021-11-12 ENCOUNTER — Other Ambulatory Visit: Payer: Self-pay | Admitting: *Deleted

## 2021-11-12 ENCOUNTER — Encounter: Payer: Self-pay | Admitting: Family Medicine

## 2021-11-12 MED ORDER — VALACYCLOVIR HCL 1 G PO TABS: 1000.0000 mg | ORAL_TABLET | Freq: Three times a day (TID) | ORAL | 0 refills | Status: DC

## 2022-03-21 ENCOUNTER — Encounter: Payer: Self-pay | Admitting: *Deleted

## 2022-06-03 ENCOUNTER — Other Ambulatory Visit: Payer: Self-pay | Admitting: Obstetrics & Gynecology

## 2022-06-03 DIAGNOSIS — Z1231 Encounter for screening mammogram for malignant neoplasm of breast: Secondary | ICD-10-CM

## 2022-06-25 IMAGING — MG MM DIGITAL SCREENING BILAT W/ TOMO AND CAD
8 series · 9 of 24 positions shown · non-contrast
Comparison: Previous exam(s).

CLINICAL DATA: Screening.

EXAM:
DIGITAL SCREENING BILATERAL MAMMOGRAM WITH TOMOSYNTHESIS AND CAD
TECHNIQUE: Bilateral screening digital craniocaudal and mediolateral oblique
mammograms were obtained. Bilateral screening digital breast
tomosynthesis was performed. The images were evaluated with
computer-aided detection.

[L CC synth-2D]
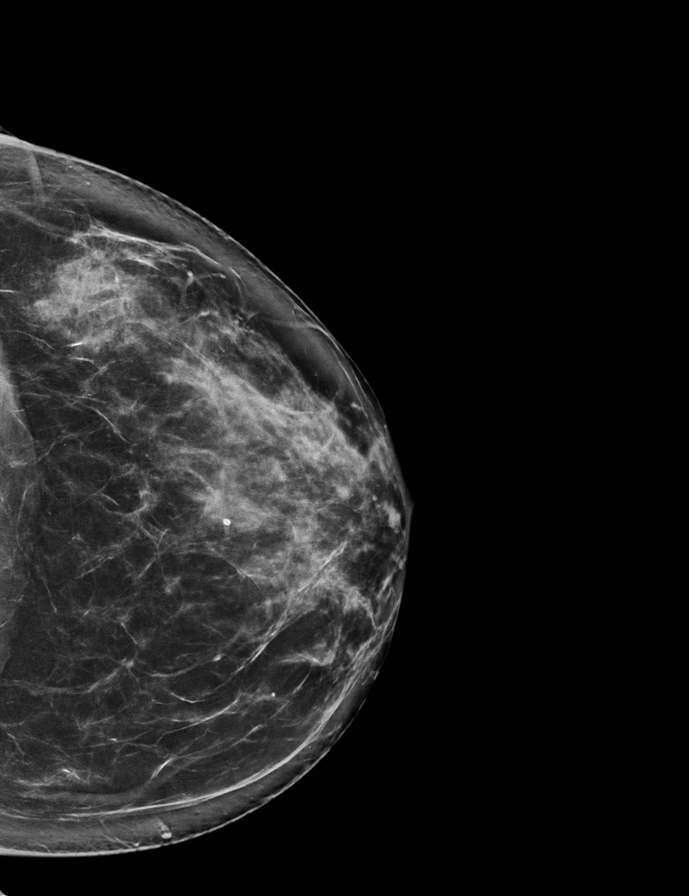

[R MLO synth-2D]
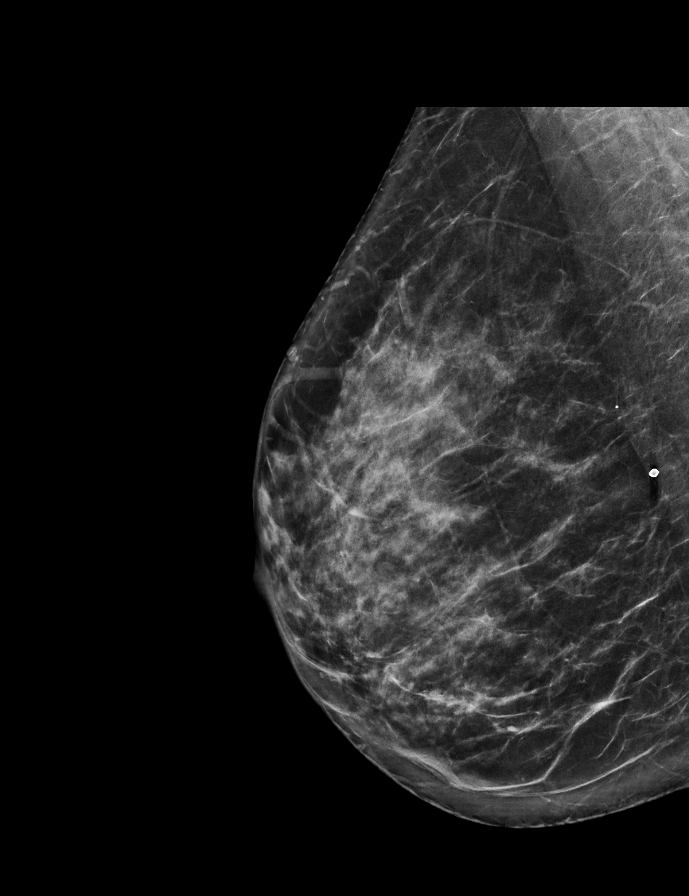

[L MLO synth-2D]
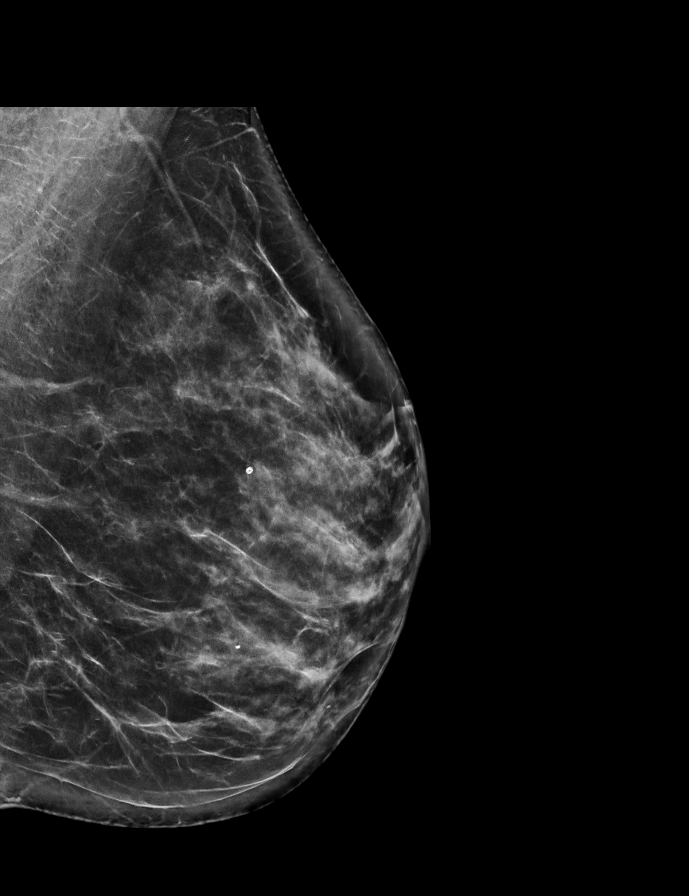

[R CC synth-2D]
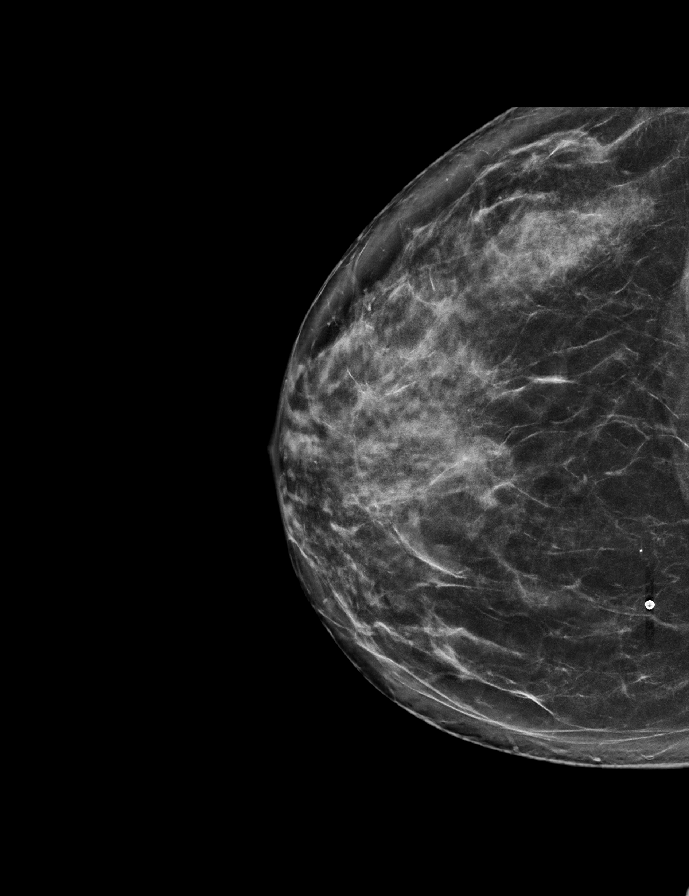

[L MLO tomo · 2 of 77 frames shown]
[frame 25/77]
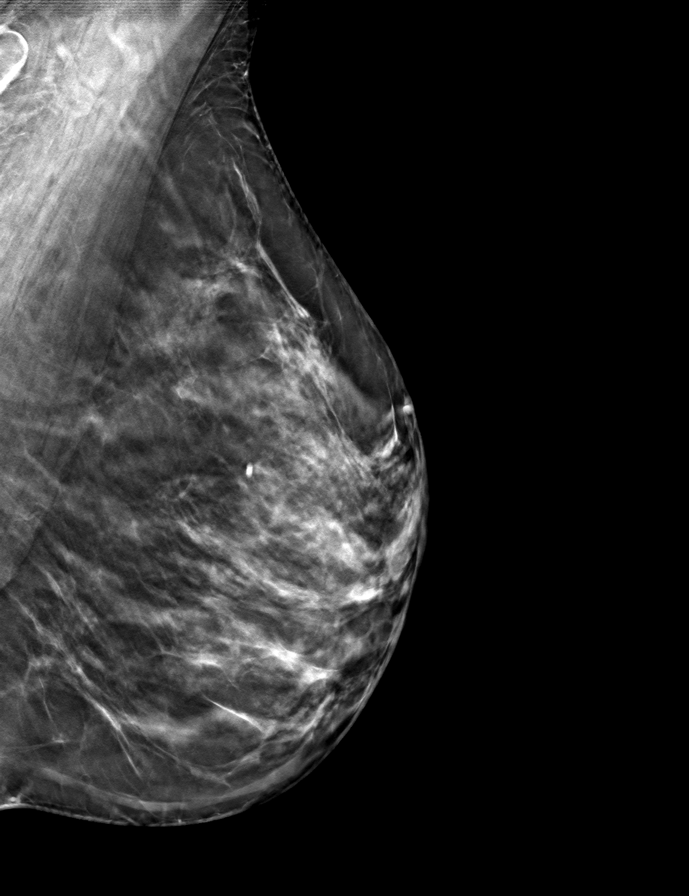
[frame 39/77]
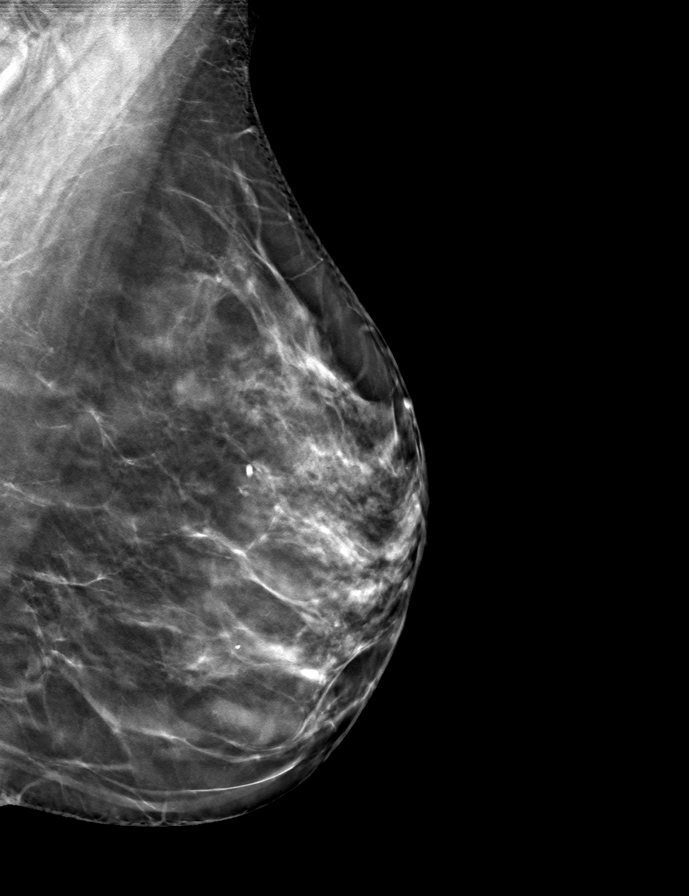

[R CC tomo · tomo slice 39/76.0]
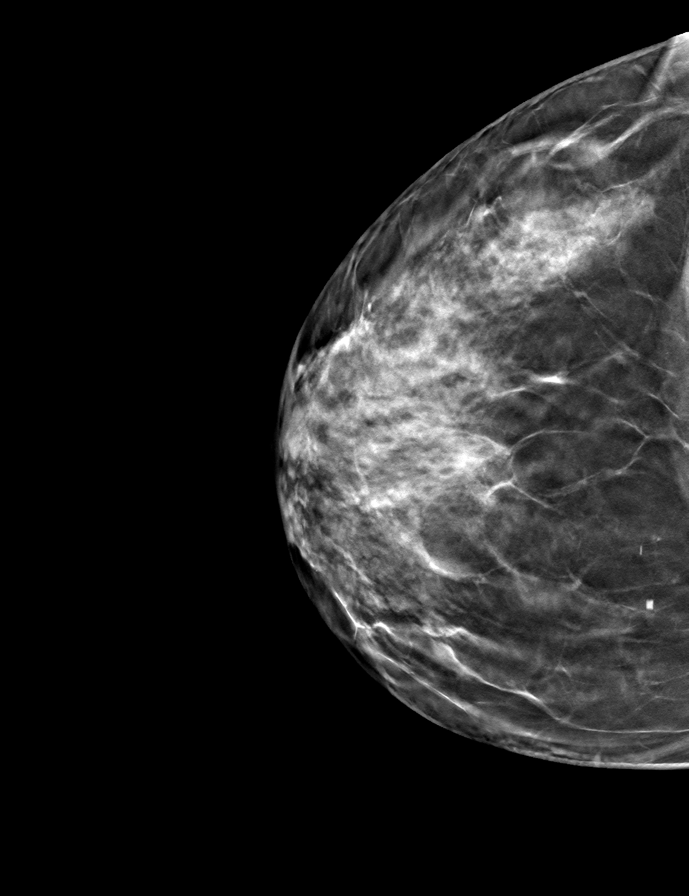

[R MLO tomo · tomo slice 37/74.0]
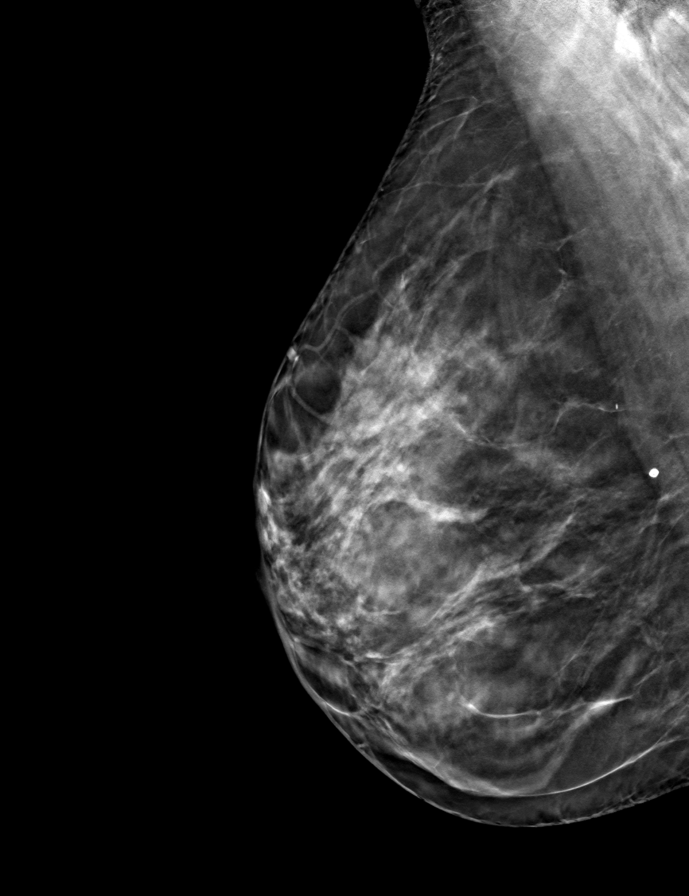

[L CC tomo · tomo slice 39/78.0]
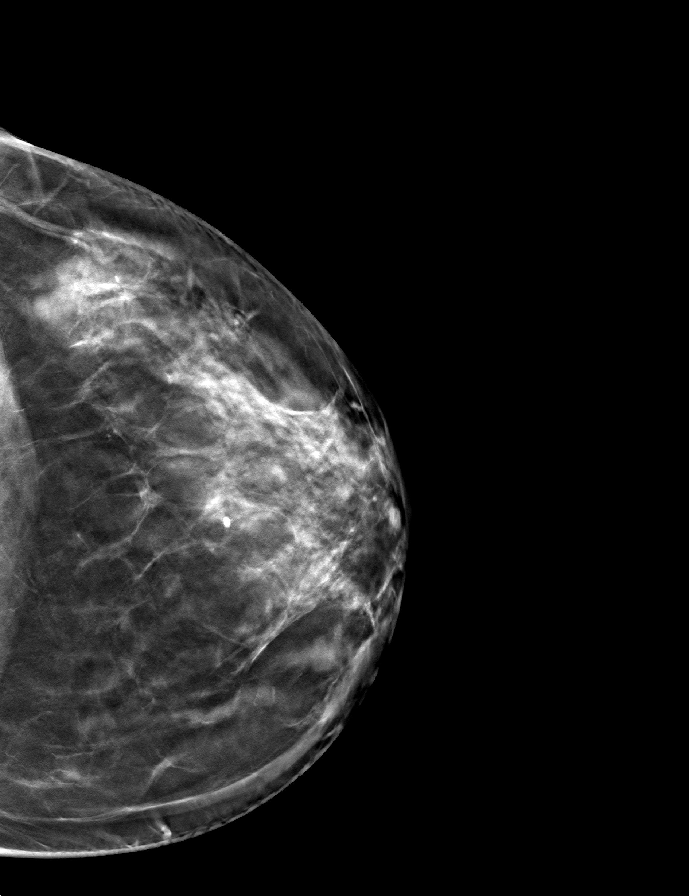

[9 of 24 positions shown; findings below may reference images not displayed]

ACR Breast Density Category c: The breast tissue is heterogeneously
dense, which may obscure small masses.
FINDINGS: There are no findings suspicious for malignancy.
IMPRESSION: No mammographic evidence of malignancy. A result letter of this
screening mammogram will be mailed directly to the patient.

RECOMMENDATION:
Screening mammogram in one year. (Code:Q3-W-BC3)

BI-RADS CATEGORY  1: Negative.

## 2022-07-08 ENCOUNTER — Encounter: Payer: Self-pay | Admitting: Family Medicine

## 2022-07-10 ENCOUNTER — Ambulatory Visit: Payer: 59 | Admitting: Family Medicine

## 2022-07-10 VITALS — BP 120/80 | HR 76 | Temp 98.5°F | Ht 63.75 in | Wt 154.4 lb

## 2022-07-10 DIAGNOSIS — H01139 Eczematous dermatitis of unspecified eye, unspecified eyelid: Secondary | ICD-10-CM

## 2022-07-10 MED ORDER — TRIAMCINOLONE ACETONIDE 0.1 % EX CREA: 1.0000 | TOPICAL_CREAM | Freq: Two times a day (BID) | CUTANEOUS | 0 refills | Status: DC

## 2022-07-10 NOTE — Patient Instructions (Signed)
Please return for a complete physical at your convenience.  If you have any questions or concerns, please don't hesitate to send me a message via MyChart or call the office at [phone]. Thank you for visiting with [place] today! It's our pleasure caring for you.   Atopic Dermatitis Atopic dermatitis is a skin disorder that causes inflammation of the skin. It is marked by a red rash and itchy, dry, scaly skin. It is the most common type of eczema. Eczema is a group of skin conditions that cause the skin to become rough and swollen. This condition is generally worse during the cooler winter months and often improves during the warm summer months. Atopic dermatitis usually starts showing signs in infancy and can last through adulthood. This condition cannot be passed from one person to another (is not contagious). Atopic dermatitis may not always be present, but when it is, it is called a flare-up. What are the causes? The exact cause of this condition is not known. Flare-ups may be triggered by: Coming in contact with something that you are sensitive or allergic to (allergen). Stress. Certain foods. Extremely hot or cold weather. Harsh chemicals and soaps. Dry air. Chlorine. What increases the risk? This condition is more likely to develop in people who have a personal or family history of: Eczema. Allergies. Asthma. Hay fever. What are the signs or symptoms? Symptoms of this condition include: Dry, scaly skin. Red, itchy rash. Itchiness, which can be severe. This may occur before the skin rash. This can make sleeping difficult. Skin thickening and cracking that can occur over time. How is this diagnosed? This condition is diagnosed based on: Your symptoms. Your medical history. A physical exam. How is this treated? There is no cure for this condition, but symptoms can usually be controlled. Treatment focuses on: Controlling the itchiness and scratching. You may be given medicines,  such as antihistamines or steroid creams. Limiting exposure to allergens. Recognizing situations that cause stress and developing a plan to manage stress. If your atopic dermatitis does not get better with medicines, or if it is all over your body (widespread), a treatment using a specific type of light (phototherapy) may be used. Follow these instructions at home: Skin care  Keep your skin well moisturized. Doing this seals in moisture and helps to prevent dryness. Use unscented lotions that have petroleum in them. Avoid lotions that contain alcohol or water. They can dry the skin. Keep baths or showers short (less than 5 minutes) in warm water. Do not use hot water. Use mild, unscented cleansers for bathing. Avoid soap and bubble bath. Apply a moisturizer to your skin right after a bath or shower. Do not apply anything to your skin without checking with your health care provider. General instructions Take or apply over-the-counter and prescription medicines only as told by your health care provider. Dress in clothes made of cotton or cotton blends. Dress lightly because heat increases itchiness. When washing your clothes, rinse your clothes twice so all of the soap is removed. Avoid any triggers that can cause a flare-up. Keep your fingernails cut short. Avoid scratching. Scratching makes the rash and itchiness worse. A break in the skin from scratching could result in a skin infection (impetigo). Do not be around people who have cold sores or fever blisters. If you get the infection, it may cause your atopic dermatitis to worsen. Keep all follow-up visits. This is important. Contact a health care provider if: Your itchiness interferes with sleep. Your rash  gets worse or is not better within one week of starting treatment. You have a fever. You have a rash flare-up after having contact with someone who has cold sores or fever blisters. Get help right away if: You develop pus or soft  yellow scabs in the rash area. Summary Atopic dermatitis causes a red rash and itchy, dry, scaly skin. Treatment focuses on controlling the itchiness and scratching, limiting exposure to things that you are sensitive or allergic to (allergens), recognizing situations that cause stress, and developing a plan to manage stress. Keep your skin well moisturized. Keep baths or showers shorter than 5 minutes and use warm water. Do not use hot water. This information is not intended to replace advice given to you by your health care provider. Make sure you discuss any questions you have with your health care provider. Document Revised: 01/03/2020 Document Reviewed: 01/03/2020 Elsevier Patient Education  [place].

## 2022-07-10 NOTE — Progress Notes (Signed)
Subjective  CC:  Chief Complaint  Patient presents with   swollen eyes    Pt stated that both her eyes have been swollen for the past week.     HPI: Shannon Lawrence is a 42 y.o. female who presents to the office today to address the problems listed above in the chief complaint. 42 year old female with history of chronic allergies presents due to red patches on upper eyelids for the last week or so.  Some flaking.  Some itching.  Had some associated swelling as well.  No red itchy eyes or draining eyes.  Allergies are well-controlled with Zyrtec.  She does report that she was recently to her dermatologist for similar place on the back of her neck treated with a steroid cream.  That has resolved.  No systemic symptoms.  Rarely rubs her eyes.  Assessment  1. Atopic dermatitis of eyelid      Plan  Atopic dermatitis: Education given.  Trial of over-the-counter hydrocortisone steroid cream twice daily.  Can step up to triamcinolone 0.1% cream if needed.  Limit use given thin skin of the eyelid.  Continue Zyrtec.  Should improve.  Follow up: Recommend complete physical at patient's convenience Visit date not found  No orders of the defined types were placed in this encounter.  Meds ordered this encounter  Medications   triamcinolone cream (KENALOG) 0.1 %    Sig: Apply 1 Application topically 2 (two) times daily. For 2 weeks    Dispense:  15 g    Refill:  0      I reviewed the patients updated PMH, FH, and SocHx.    Patient Active Problem List   Diagnosis Date Noted   Chronic ethmoidal sinusitis 07/23/2019   Chronic maxillary sinusitis 07/23/2019   Chronic allergic rhinitis 07/15/2019   Tinea corporis 05/09/2016   Current Meds  Medication Sig   triamcinolone cream (KENALOG) 0.1 % Apply 1 Application topically 2 (two) times daily. For 2 weeks    Allergies: Patient is allergic to sulfa drugs cross reactors. Family History: Patient family history includes Brain cancer in  her maternal grandmother; COPD in an other family member; Cancer in her maternal grandfather and another family member; Diabetes in her paternal grandfather; Hyperlipidemia in an other family member; Hypertension in her mother; Stroke in her paternal grandmother. Social History:  Patient  reports that she has never smoked. She has never used smokeless tobacco. She reports current alcohol use. She reports that she does not use drugs.  Review of Systems: Constitutional: Negative for fever malaise or anorexia Cardiovascular: negative for chest pain Respiratory: negative for SOB or persistent cough Gastrointestinal: negative for abdominal pain  Objective  Vitals: BP 120/80   Pulse 76   Temp 98.5 F (36.9 C)   Ht 5' 3.75" (1.619 m)   Wt 154 lb 6.4 oz (70 kg)   LMP 10/15/2016 (Approximate)   SpO2 99%   BMI 26.71 kg/m  General: no acute distress , A&Ox3 HEENT: PEERL, conjunctiva normal, neck is supple Skin:  Warm, bilateral upper eyelids with erythematous patches mild flaking, see picture from her MyChart message  Commons side effects, risks, benefits, and alternatives for medications and treatment plan prescribed today were discussed, and the patient expressed understanding of the given instructions. Patient is instructed to call or message via MyChart if he/she has any questions or concerns regarding our treatment plan. No barriers to understanding were identified. We discussed Red Flag symptoms and signs in detail. Patient expressed  understanding regarding what to do in case of urgent or emergency type symptoms.  Medication list was reconciled, printed and provided to the patient in AVS. Patient instructions and summary information was reviewed with the patient as documented in the AVS. This note was prepared with assistance of Dragon voice recognition software. Occasional wrong-word or sound-a-like substitutions may have occurred due to the inherent limitations of voice recognition software

## 2022-07-22 ENCOUNTER — Ambulatory Visit
Admission: RE | Admit: 2022-07-22 | Discharge: 2022-07-22 | Disposition: A | Discharge status: Home / Self Care | Payer: 59 | Source: Ambulatory Visit | Attending: Obstetrics & Gynecology | Admitting: Obstetrics & Gynecology

## 2022-07-22 DIAGNOSIS — Z1231 Encounter for screening mammogram for malignant neoplasm of breast: Secondary | ICD-10-CM

## 2022-09-09 NOTE — Progress Notes (Signed)
Shannon Lawrence is a 42 y.o. female here for a new problem.  History of Present Illness:   No chief complaint on file.   HPI Cough: ***    Past Medical History:  Diagnosis Date   Cyst (solitary) of breast left breast   patient denies this is a part of her medical history 10/28/16   History of gestational diabetes      Social History   Tobacco Use   Smoking status: Never   Smokeless tobacco: Never  Vaping Use   Vaping Use: Never used  Substance Use Topics   Alcohol use: Yes    Comment: social   Drug use: No    Past Surgical History:  Procedure Laterality Date   ABDOMINAL HYSTERECTOMY N/A 11/05/2016   Procedure: TOTAL ABDOMINAL HYSTERECTOMY;  Surgeon: Genia Del, MD;  Location: [facility];  Service: Gynecology;  Laterality: N/A;   BILATERAL SALPINGECTOMY Bilateral 11/05/2016   Procedure: BILATERAL SALPINGECTOMY;  Surgeon: Genia Del, MD;  Location: [facility];  Service: Gynecology;  Laterality: Bilateral;   EYE SURGERY     2000 left eye   NASAL SINUS SURGERY  10/01/2019   ROBOTIC ASSISTED TOTAL HYSTERECTOMY WITH SALPINGECTOMY Bilateral 11/05/2016   Procedure: ATTEMPTED ROBOTIC ASSISTED TOTAL HYSTERECTOMY WITH BILATERAL SALPINGECTOMY;  Surgeon: Genia Del, MD;  Location: [facility];  Service: Gynecology;  Laterality: Bilateral;    Family History  Problem Relation Age of Onset   Brain cancer Maternal Grandmother    Cancer Other    COPD Other    Hyperlipidemia Other    Hypertension Mother    Cancer Maternal Grandfather        stomach   Stroke Paternal Grandmother    Diabetes Paternal Grandfather     Allergies  Allergen Reactions   Sulfa Drugs Cross Reactors Other (See Comments)    Reaction unknown    Current Medications:   Current Outpatient Medications:    triamcinolone cream (KENALOG) 0.1 %, Apply 1 Application topically 2 (two) times daily. For 2 weeks, Disp: 15 g, Rfl: 0   Review of Systems:   ROS  Vitals:   There were no vitals  filed for this visit.   There is no height or weight on file to calculate BMI.  Physical Exam:   Physical Exam  Assessment and Plan:   There are no diagnoses linked to this encounter.  I,Rachel Rivera,acting as a Neurosurgeon for [employer], PA.,have documented all relevant documentation on the behalf of Jarold Motto, PA,as directed by  Jarold Motto, PA while in the presence of Jarold Motto, [place].  ***  Jarold Motto, PA-C

## 2022-09-10 ENCOUNTER — Encounter: Payer: Self-pay | Admitting: Physician Assistant

## 2022-09-10 ENCOUNTER — Ambulatory Visit: Payer: 59 | Admitting: Physician Assistant

## 2022-09-10 VITALS — BP 120/80 | HR 79 | Temp 97.7°F | Ht 63.75 in | Wt 154.0 lb

## 2022-09-10 DIAGNOSIS — R051 Acute cough: Secondary | ICD-10-CM | POA: Diagnosis not present

## 2022-09-10 DIAGNOSIS — H6123 Impacted cerumen, bilateral: Secondary | ICD-10-CM | POA: Diagnosis not present

## 2022-09-10 LAB — POCT RAPID STREP A (OFFICE): Rapid Strep A Screen: NEGATIVE

## 2022-09-10 MED ORDER — AMOXICILLIN-POT CLAVULANATE 600-42.9 MG/5ML PO SUSR: 875.0000 mg | Freq: Two times a day (BID) | ORAL | 0 refills | Status: DC

## 2022-09-10 MED ORDER — AMOXICILLIN-POT CLAVULANATE 875-125 MG PO TABS: 1.0000 | ORAL_TABLET | Freq: Two times a day (BID) | ORAL | 0 refills | Status: DC

## 2022-09-10 NOTE — Patient Instructions (Signed)
It was great to see you!  You have a viral upper respiratory infection. Antibiotics are not needed for this.  Viral infections usually take 7-10 days to resolve.  The cough can last a few weeks to go away.  I recommend starting daily antihistamine, such as zyrtec Start daily mucinex and Flonase nasal spray  If no improvement of symptoms, or any worsening, recommend that you start oral antibiotic that I have sent in  Please return if you are not improving as expected, or if you have high fevers (>101.5) or difficulty swallowing or worsening productive cough.  Call clinic with questions.  I hope you start feeling better soon!

## 2022-09-16 ENCOUNTER — Ambulatory Visit: Payer: 59 | Admitting: Family Medicine

## 2022-09-16 ENCOUNTER — Encounter: Payer: Self-pay | Admitting: Family Medicine

## 2022-09-16 VITALS — BP 130/80 | HR 79 | Temp 98.7°F | Ht 63.75 in | Wt 152.4 lb

## 2022-09-16 DIAGNOSIS — J208 Acute bronchitis due to other specified organisms: Secondary | ICD-10-CM | POA: Diagnosis not present

## 2022-09-16 DIAGNOSIS — R051 Acute cough: Secondary | ICD-10-CM | POA: Diagnosis not present

## 2022-09-16 DIAGNOSIS — J9801 Acute bronchospasm: Secondary | ICD-10-CM

## 2022-09-16 LAB — POC COVID19 BINAXNOW: SARS Coronavirus 2 Ag: NEGATIVE

## 2022-09-16 MED ORDER — PREDNISONE 20 MG PO TABS: ORAL_TABLET | ORAL | 0 refills | Status: DC

## 2022-09-16 NOTE — Progress Notes (Signed)
Subjective  CC:  Chief Complaint  Patient presents with   Cough    Pt stated that she has had a cough for the past week and is has not gotten any better    HPI: SUBJECTIVE:  Shannon Lawrence is a 42 y.o. female who complains of congestion, nasal blockage, post nasal drip, cough described as paroxysmal and productive and denies sinus, high fevers, SOB, chest pain or significant GI symptoms. Symptoms have been present for 1 week.  She has completed Augmentin.  No longer feeling puny.  Cough persists.  She worries because her husband was recently sick and hospitalized for pneumonia, possibly COVID-related but not tested.. She denies a history of anorexia, dizziness, vomiting and wheezing. She denies a history of asthma or COPD. Patient does not smoke cigarettes.  She denies sinus pain.  She does have a headache.  Assessment  1. Viral bronchitis   2. Acute cough   3. Bronchospasm      Plan  Discussion:  Likely viral, has been treated for bacterial bronchitis.  Symptoms now are mostly postinflammatory.  Recommend Zyrtec nightly, Mucinex DM and/or Sudafed.  Delsym for cough if needed.  Start prednisone burst for 5 days to treat bronchospasm and inflammation.  No red flag symptoms present.  Does not seem consistent with sinusitis. Follow up: Overdue for complete physical  Orders Placed This Encounter  Procedures   POC COVID-19   Meds ordered this encounter  Medications   predniSONE (DELTASONE) 20 MG tablet    Sig: Take 3 tabs daily for 5 days    Dispense:  15 tablet    Refill:  0      I reviewed the patients updated PMH, FH, and SocHx.  Social History: Patient  reports that she has never smoked. She has never used smokeless tobacco. She reports current alcohol use. She reports that she does not use drugs.  Patient Active Problem List   Diagnosis Date Noted   Chronic ethmoidal sinusitis 07/23/2019   Chronic maxillary sinusitis 07/23/2019   Chronic allergic rhinitis 07/15/2019    Tinea corporis 05/09/2016    Review of Systems: Cardiovascular: negative for chest pain Respiratory: negative for SOB or hemoptysis Gastrointestinal: negative for abdominal pain Genitourinary: negative for dysuria or gross hematuria Current Meds  Medication Sig   predniSONE (DELTASONE) 20 MG tablet Take 3 tabs daily for 5 days   triamcinolone cream (KENALOG) 0.1 % Apply 1 Application topically 2 (two) times daily. For 2 weeks    Objective  Vitals: BP 130/80   Pulse 79   Temp 98.7 F (37.1 C)   Ht 5' 3.75" (1.619 m)   Wt 152 lb 6.4 oz (69.1 kg)   LMP 10/15/2016 (Approximate)   SpO2 98%   BMI 26.36 kg/m  General: no acute distress, occasional coughing spasm Psych:  Alert and oriented, normal mood and affect HEENT:  Normocephalic, atraumatic, supple neck, nasal congestion present, no sinus tenderness present, moist mucous membranes, mildly erythematous pharynx without exudate, no lymphadenopathy, supple neck Cardiovascular:  RRR without murmur. no edema Respiratory:  Good breath sounds bilaterally, CTAB with normal respiratory effort with mild expiratory wheeze right base, no rales Skin:  Warm, no rashes  Office Visit on 09/16/2022  Component Date Value Ref Range Status   SARS Coronavirus 2 Ag 09/16/2022 Negative  Negative Final    Commons side effects, risks, benefits, and alternatives for medications and treatment plan prescribed today were discussed, and the patient expressed understanding of the given instructions.  Patient is instructed to call or message via MyChart if he/she has any questions or concerns regarding our treatment plan. No barriers to understanding were identified. We discussed Red Flag symptoms and signs in detail. Patient expressed understanding regarding what to do in case of urgent or emergency type symptoms.  Medication list was reconciled, printed and provided to the patient in AVS. Patient instructions and summary information was reviewed with the  patient as documented in the AVS. This note was prepared with assistance of Dragon voice recognition software. Occasional wrong-word or sound-a-like substitutions may have occurred due to the inherent limitations of voice recognition software

## 2022-09-16 NOTE — Patient Instructions (Signed)
Please schedule a visit for your annual complete physical; please come fasting.   Take zyrtec nightly and add the prednisone for cough. Delsym can be helpful to manage cough symptoms as well. Sudafed as needed can help with congestion and post nasal drainage which could be contributing to your cough.   If you have any questions or concerns, please don't hesitate to send me a message via MyChart or call the office at [phone]. Thank you for visiting with [place] today! It's our pleasure caring for you.

## 2022-10-02 ENCOUNTER — Ambulatory Visit (INDEPENDENT_AMBULATORY_CARE_PROVIDER_SITE_OTHER): Payer: 59 | Admitting: Obstetrics & Gynecology

## 2022-10-02 ENCOUNTER — Encounter: Payer: Self-pay | Admitting: Obstetrics & Gynecology

## 2022-10-02 VITALS — BP 122/82 | HR 90 | Ht 63.5 in | Wt 152.0 lb

## 2022-10-02 DIAGNOSIS — Z90710 Acquired absence of both cervix and uterus: Secondary | ICD-10-CM | POA: Diagnosis not present

## 2022-10-02 DIAGNOSIS — Z01419 Encounter for gynecological examination (general) (routine) without abnormal findings: Secondary | ICD-10-CM

## 2022-10-02 NOTE — Progress Notes (Signed)
Shannon Lawrence 1981-02-28 [id number]   History:    42 y.o. G4P2A2L2 Married.  Sons 15 and 39 yo.   RP:  Established patient presenting for annual gyn exam    HPI: TAH/Bilateral Salpingectomy for Fibroids 10/2016.  Patho benign. No pelvic pain.  No pain with IC.  Breasts normal. Mammo Neg 07/2022. Urine/BMs normal.  BMI 26.5. Good fitness with Peloton and Pilates. [facility] 04/2020 was normal at 8.8.  Health labs with Fam Lawrence.    Past medical history,surgical history, family history and social history were all reviewed and documented in the EPIC chart.  Gynecologic History Patient's last menstrual period was 10/15/2016 (approximate).  Obstetric History OB History  Gravida Para Term Preterm AB Living  4 2 2   2 2   SAB IAB Ectopic Multiple Live Births  2       2    # Outcome Date GA Lbr Len/2nd Weight Sex Delivery Anes PTL Lv  4 Term 12/19/11 [redacted]w[redacted]d 02:19 / 00:10 6 lb 10.9 oz (3.03 kg) M Vag-Spont None  LIV  3 SAB 09/18/10          2 SAB 04/19/10          1 Term 04/20/07             ROS: A ROS was performed and pertinent positives and negatives are included in the history. GENERAL: No fevers or chills. HEENT: No change in vision, no earache, sore throat or sinus congestion. NECK: No pain or stiffness. CARDIOVASCULAR: No chest pain or pressure. No palpitations. PULMONARY: No shortness of breath, cough or wheeze. GASTROINTESTINAL: No abdominal pain, nausea, vomiting or diarrhea, melena or bright red blood per rectum. GENITOURINARY: No urinary frequency, urgency, hesitancy or dysuria. MUSCULOSKELETAL: No joint or muscle pain, no back pain, no recent trauma. DERMATOLOGIC: No rash, no itching, no lesions. ENDOCRINE: No polyuria, polydipsia, no heat or cold intolerance. No recent change in weight. HEMATOLOGICAL: No anemia or easy bruising or bleeding. NEUROLOGIC: No headache, seizures, numbness, tingling or weakness. PSYCHIATRIC: No depression, no loss of interest in normal activity or change in  sleep pattern.     Exam:   BP 122/82 (BP Location: Right Arm, Patient Position: Sitting, Cuff Size: Normal)   Pulse 90   Ht 5' 3.5" (1.613 m)   Wt 152 lb (68.9 kg)   LMP 10/15/2016 (Approximate)   SpO2 99%   BMI 26.50 kg/m   Body mass index is 26.5 kg/m.  General appearance : Well developed well nourished female. No acute distress HEENT: Eyes: no retinal hemorrhage or exudates,  Neck supple, trachea midline, no carotid bruits, no thyroidmegaly Lungs: Clear to auscultation, no rhonchi or wheezes, or rib retractions  Heart: Regular rate and rhythm, no murmurs or gallops Breast:Examined in sitting and supine position were symmetrical in appearance, no palpable masses or tenderness,  no skin retraction, no nipple inversion, no nipple discharge, no skin discoloration, no axillary or supraclavicular lymphadenopathy Abdomen: no palpable masses or tenderness, no rebound or guarding Extremities: no edema or skin discoloration or tenderness  Pelvic: Vulva: Normal             Vagina: No gross lesions or discharge  Cervix/Uterus absent  Adnexa  Without masses or tenderness  Anus: Normal   Assessment/Plan:  42 y.o. female for annual exam   1. Well female exam with routine gynecological exam TAH/Bilateral Salpingectomy for Fibroids 10/2016.  Patho benign. No pelvic pain.  No pain with IC.  Breasts normal. Mammo Neg  07/2022. Urine/BMs normal.  BMI 26.5. Good fitness with Peloton and Pilates. [facility] 04/2020 was normal at 8.8.  Health labs with Fam Lawrence.  2. S/P total hysterectomy   Shannon Lawrence, 2:15 PM

## 2023-03-26 ENCOUNTER — Encounter: Payer: Self-pay | Admitting: Family Medicine

## 2023-03-26 ENCOUNTER — Ambulatory Visit (INDEPENDENT_AMBULATORY_CARE_PROVIDER_SITE_OTHER): Payer: 59 | Admitting: Family Medicine

## 2023-03-26 VITALS — BP 130/84 | HR 77 | Temp 98.4°F | Ht 63.5 in | Wt 161.0 lb

## 2023-03-26 DIAGNOSIS — Z23 Encounter for immunization: Secondary | ICD-10-CM

## 2023-03-26 DIAGNOSIS — Z90710 Acquired absence of both cervix and uterus: Secondary | ICD-10-CM | POA: Insufficient documentation

## 2023-03-26 DIAGNOSIS — Z1159 Encounter for screening for other viral diseases: Secondary | ICD-10-CM | POA: Diagnosis not present

## 2023-03-26 DIAGNOSIS — L209 Atopic dermatitis, unspecified: Secondary | ICD-10-CM | POA: Insufficient documentation

## 2023-03-26 DIAGNOSIS — Z0000 Encounter for general adult medical examination without abnormal findings: Secondary | ICD-10-CM

## 2023-03-26 DIAGNOSIS — L2084 Intrinsic (allergic) eczema: Secondary | ICD-10-CM | POA: Diagnosis not present

## 2023-03-26 DIAGNOSIS — L309 Dermatitis, unspecified: Secondary | ICD-10-CM

## 2023-03-26 HISTORY — DX: Acquired absence of both cervix and uterus: Z90.710

## 2023-03-26 HISTORY — DX: Atopic dermatitis, unspecified: L20.9

## 2023-03-26 LAB — COMPREHENSIVE METABOLIC PANEL
ALT: 18 U/L (ref 0–35)
AST: 20 U/L (ref 0–37)
Albumin: 4.3 g/dL (ref 3.5–5.2)
Alkaline Phosphatase: 53 U/L (ref 39–117)
BUN: 11 mg/dL (ref 6–23)
CO2: 27 meq/L (ref 19–32)
Calcium: 9.1 mg/dL (ref 8.4–10.5)
Chloride: 103 meq/L (ref 96–112)
Creatinine, Ser: 0.75 mg/dL (ref 0.40–1.20)
GFR: 96.94 mL/min (ref >60.00)
Glucose, Bld: 86 mg/dL (ref 70–99)
Potassium: 3.9 meq/L (ref 3.5–5.1)
Sodium: 139 meq/L (ref 135–145)
Total Bilirubin: 0.8 mg/dL (ref 0.2–1.2)
Total Protein: 6.9 g/dL (ref 6.0–8.3)

## 2023-03-26 LAB — LIPID PANEL
Cholesterol: 149 mg/dL (ref 0–200)
HDL: 58.1 mg/dL (ref >39.00)
LDL Cholesterol: 78 mg/dL (ref 0–99)
NonHDL: 90.63
Total CHOL/HDL Ratio: 3
Triglycerides: 62 mg/dL (ref 0.0–149.0)
VLDL: 12.4 mg/dL (ref 0.0–40.0)

## 2023-03-26 LAB — CBC WITH DIFFERENTIAL/PLATELET
Basophils Absolute: 0.1 10*3/uL (ref 0.0–0.1)
Basophils Relative: 1.2 % (ref 0.0–3.0)
Eosinophils Absolute: 0.4 10*3/uL (ref 0.0–0.7)
Eosinophils Relative: 7.4 % — ABNORMAL HIGH (ref 0.0–5.0)
HCT: 40.7 % (ref 36.0–46.0)
Hemoglobin: 14.1 g/dL (ref 12.0–15.0)
Lymphocytes Relative: 23.1 % (ref 12.0–46.0)
Lymphs Abs: 1.4 10*3/uL (ref 0.7–4.0)
MCHC: 34.6 g/dL (ref 30.0–36.0)
MCV: 92.8 fL (ref 78.0–100.0)
Monocytes Absolute: 0.4 10*3/uL (ref 0.1–1.0)
Monocytes Relative: 6.7 % (ref 3.0–12.0)
Neutro Abs: 3.7 10*3/uL (ref 1.4–7.7)
Neutrophils Relative %: 61.6 % (ref 43.0–77.0)
Platelets: 379 10*3/uL (ref 150.0–400.0)
RBC: 4.39 Mil/uL (ref 3.87–5.11)
RDW: 12 % (ref 11.5–15.5)
WBC: 5.9 10*3/uL (ref 4.0–10.5)

## 2023-03-26 LAB — TSH: TSH: 1 u[IU]/mL (ref 0.35–5.50)

## 2023-03-26 LAB — VITAMIN D 25 HYDROXY (VIT D DEFICIENCY, FRACTURES): VITD: 35.59 ng/mL (ref 30.00–100.00)

## 2023-03-26 NOTE — Progress Notes (Signed)
Subjective  Chief Complaint  Patient presents with   Annual Exam    Pt here for annual exam and is currently fasting      HPI: Shannon Lawrence is a 42 y.o. female who presents to [facility] [place] today for a Female Wellness Visit.  She also has the concerns and/or needs as listed above in the chief complaint. These will be addressed in addition to the Health Maintenance Visit.   Wellness Visit: annual visit with health maintenance review and exam Health maintenance: Sees gynecology, reviewed last note.   Pap smear no longer indicated status post complete hysterectomy due to painful fibroids.  Mammogram up-to-date and normal.  Colorectal cancer screening to start next year.  Healthy lifestyle.  No concerns.  Eligible for Tdap booster today.  She declines flu shot. Chronic disease management visit and/or acute problem visit: Discussed the use of AI scribe software for clinical note transcription with the patient, who gave verbal consent to proceed.  History of Present Illness   The patient presents with dry skin around her eyes, which she has been managing with Aquaphor, Vaseline, and Neutrogena Hydro Boost eye cream. She has also tried Opzelura, as recommended by her dermatologist. She suspects that the dryness might be due to allergies and has been taking Zyrtec. She has been careful not to rub her eyes, especially when wearing makeup. She has not noticed any improvement with the current regimen and is considering investing in a medical-grade skincare product. She has never had a facial before but is open to trying it. She also has patches of dry skin on her body, which she believes might be eczema.      Assessment  1. Annual physical exam   2. Need for hepatitis C screening test   3. Need for Tdap vaccination   4. Intrinsic atopic dermatitis   5. History of total hysterectomy   6. Periorbital dermatitis      Plan  Female Wellness Visit: Age appropriate Health  Maintenance and Prevention measures were discussed with patient. Included topics are cancer screening recommendations, ways to keep healthy (see AVS) including dietary and exercise recommendations, regular eye and dental care, use of seat belts, and avoidance of moderate alcohol use and tobacco use.  Screens are current BMI: discussed patient's BMI and encouraged positive lifestyle modifications to help get to or maintain a target BMI. HM needs and immunizations were addressed and ordered. See below for orders. See HM and immunization section for updates.  Tdap booster given today Routine labs and screening tests ordered including cmp, cbc and lipids where appropriate. Discussed recommendations regarding Vit D and calcium supplementation (see AVS)  Chronic disease f/u and/or acute problem visit: (deemed necessary to be done in addition to the wellness visit): Assessment and Plan    Periorbital Dermatitis Chronic dryness and irritation around the eyes, likely exacerbated by allergies and rubbing. Currently using Opzelura with some benefit. Advised to avoid triamcinolone cream due to risk of skin thinning. Discussed benefits of medical-grade eye cream and hydrofacial for additional skin care. - Recommend high-quality medical-grade eye cream. - Consider hydrofacial for additional skin care.  Eczema Chronic eczema with current patches on the body. Emphasized importance of consistent skin care regimen and moisturization to manage symptoms. - Continue current skin care regimen and moisturization. - Consider additional treatments if symptoms worsen.  General Health Maintenance Routine health maintenance visit. Up to date on mammogram and gynecologic exam. Discussed importance of tetanus booster with  pertussis component to prevent whooping cough. - Administer tetanus booster today. - Order comprehensive blood work including vitamin D, thyroid, kidney, liver, and glucose tests.      Follow up: 12 mo  for cpe  Orders Placed This Encounter  Procedures   Tdap vaccine greater than or equal to 7yo IM   VITAMIN D 25 Hydroxy (Vit-D Deficiency, Fractures)   CBC with Differential/Platelet   Comprehensive metabolic panel   Lipid panel   TSH   Hepatitis C antibody   No orders of the defined types were placed in this encounter.      Body mass index is 28.07 kg/m. Wt Readings from Last 3 Encounters:  03/26/23 161 lb (73 kg)  10/02/22 152 lb (68.9 kg)  09/16/22 152 lb 6.4 oz (69.1 kg)   .   Patient Active Problem List   Diagnosis Date Noted Date Diagnosed   Atopic dermatitis 03/26/2023     Sees derm    History of total hysterectomy 03/26/2023     For fibroids, complete and bilateral salpingoopherectomy    Chronic ethmoidal sinusitis 07/23/2019    Chronic maxillary sinusitis 07/23/2019    Chronic allergic rhinitis 07/15/2019    Tinea corporis 05/09/2016     Trial of topical treatment.    Health Maintenance  Topic Date Due   Hepatitis C Screening  Never done   COVID-19 Vaccine (3 - 2024-25 season) 04/11/2023 (Originally 12/08/2022)   INFLUENZA VACCINE  07/07/2023 (Originally 11/07/2022)   MAMMOGRAM  07/22/2023   DTaP/Tdap/Td (3 - Td or Tdap) 03/25/2033   HIV Screening  Completed   HPV VACCINES  Aged Out   Immunization History  Administered Date(s) Administered   PFIZER(Purple Top)SARS-COV-2 Vaccination 09/04/2019, 09/25/2019   Tdap 08/16/2011, 03/26/2023   We updated and reviewed the patient's past history in detail and it is documented below. Allergies: Patient  reports current alcohol use. Past Medical History Patient  has a past medical history of Atopic dermatitis (03/26/2023), Chronic allergic rhinitis (07/15/2019), Cyst (solitary) of breast (left breast), Fibroid uterus, History of gestational diabetes, and History of total hysterectomy (03/26/2023). Past Surgical History Patient  has a past surgical history that includes Eye surgery; Robotic assisted total  hysterectomy with salpingectomy (Bilateral, 11/05/2016); Abdominal hysterectomy (N/A, 11/05/2016); Bilateral salpingectomy (Bilateral, 11/05/2016); and Nasal sinus surgery (10/01/2019). Social History   Socioeconomic History   Marital status: Married    Spouse name: Not on file   Number of children: 2   Years of education: Not on file   Highest education level: Not on file  Occupational History    Employer: [employer]  Tobacco Use   Smoking status: Never   Smokeless tobacco: Never  Vaping Use   Vaping status: Never Used  Substance and Sexual Activity   Alcohol use: Yes    Comment: social   Drug use: No   Sexual activity: Yes    Partners: Male    Birth control/protection: Surgical    Comment: hysterectomy  Other Topics Concern   Not on file  Social History Narrative   Not on file   Social Drivers of Health   Financial Resource Strain: Not on file  Food Insecurity: Not on file  Transportation Needs: Not on file  Physical Activity: Not on file  Stress: Not on file  Social Connections: Not on file   Family History  Problem Relation Age of Onset   Brain cancer Maternal Grandmother    Cancer Other    COPD Other    Hyperlipidemia  Other    Hypertension Mother    Cancer Maternal Grandfather        stomach   Stroke Paternal Grandmother    Diabetes Paternal Grandfather    Hearing loss Father     Review of Systems: Constitutional: negative for fever or malaise Ophthalmic: negative for photophobia, double vision or loss of vision Cardiovascular: negative for chest pain, dyspnea on exertion, or new LE swelling Respiratory: negative for SOB or persistent cough Gastrointestinal: negative for abdominal pain, change in bowel habits or melena Genitourinary: negative for dysuria or gross hematuria, no abnormal uterine bleeding or disharge Musculoskeletal: negative for new gait disturbance or muscular weakness Integumentary: negative for new or persistent rashes, no  breast lumps Neurological: negative for TIA or stroke symptoms Psychiatric: negative for SI or delusions Allergic/Immunologic: negative for hives  Patient Care Team    Relationship Specialty Notifications Start End  Willow Ora, MD PCP - General Family Medicine  07/15/19   Genia Del, MD Consulting Physician Obstetrics and Gynecology  07/15/19   Christia Reading, MD Consulting Physician Otolaryngology  12/31/19     Objective  Vitals: BP 130/84   Pulse 77   Temp 98.4 F (36.9 C)   Ht 5' 3.5" (1.613 m)   Wt 161 lb (73 kg)   LMP 10/15/2016 (Approximate)   SpO2 99%   BMI 28.07 kg/m  General:  Well developed, well nourished, no acute distress  Psych:  Alert and orientedx3,normal mood and affect HEENT:  Normocephalic, atraumatic, non-icteric sclera, PERRL, supple neck without adenopathy, mass or thyromegaly Cardiovascular:  Normal S1, S2, RRR without gallop, rub or murmur Respiratory:  Good breath sounds bilaterally, CTAB with normal respiratory effort Gastrointestinal: normal bowel sounds, soft, non-tender, no noted masses. No HSM MSK: no deformities, contusions. Joints are without erythema or swelling.  Skin:  Warm, no rashes or suspicious lesions noted, periorbital skin Neurologic:    Mental status is normal. Gross motor and sensory exams are normal. Normal gait. No tremor .   Commons side effects, risks, benefits, and alternatives for medications and treatment plan prescribed today were discussed, and the patient expressed understanding of the given instructions. Patient is instructed to call or message via MyChart if he/she has any questions or concerns regarding our treatment plan. No barriers to understanding were identified. We discussed Red Flag symptoms and signs in detail. Patient expressed understanding regarding what to do in case of urgent or emergency type symptoms.  Medication list was reconciled, printed and provided to the patient in AVS. Patient instructions and  summary information was reviewed with the patient as documented in the AVS. This note was prepared with assistance of Dragon voice recognition software. Occasional wrong-word or sound-a-like substitutions may have occurred due to the inherent limitations of voice recognition software .

## 2023-03-26 NOTE — Patient Instructions (Signed)
Please return in 12 months for your annual complete physical; please come fasting.   I will release your lab results to you on your MyChart account with further instructions. You may see the results before I do, but when I review them I will send you a message with my report or have my assistant call you if things need to be discussed. Please reply to my message with any questions. Thank you!   If you have any questions or concerns, please don't hesitate to send me a message via MyChart or call the office at [phone]. Thank you for visiting with [place] today! It's our pleasure caring for you.   VISIT SUMMARY:  During today's visit, we discussed your concerns about dry skin around your eyes and on your body. We reviewed your current skincare regimen and explored additional options to help manage your symptoms. We also covered general health maintenance, including the importance of routine vaccinations and blood work.  YOUR PLAN:  -PERIORBITAL DERMATITIS: Periorbital dermatitis is a condition characterized by chronic dryness and irritation around the eyes, often worsened by allergies and rubbing. We recommend continuing with a high-quality medical-grade eye cream and considering a hydrofacial for additional skin care.  -ECZEMA: Eczema is a chronic skin condition that causes patches of dry, itchy skin. It's important to maintain a consistent skincare regimen and moisturize regularly. If your symptoms worsen, we can explore additional treatments.  -GENERAL HEALTH MAINTENANCE: You are up to date on your mammogram and gynecologic exam. We discussed the importance of getting a tetanus booster with a pertussis component to prevent whooping cough. We will also conduct comprehensive blood work to check your vitamin D, thyroid, kidney, liver, and glucose levels.  INSTRUCTIONS:  Please follow up with [place] if your symptoms do not improve or if they worsen. We will administer your tetanus booster today and order  comprehensive blood work. Make sure to schedule any additional appointments as needed.

## 2023-03-27 LAB — HEPATITIS C ANTIBODY: Hepatitis C Ab: NONREACTIVE

## 2023-03-27 NOTE — Progress Notes (Signed)
Labs reviewed.  The 10-year ASCVD risk score (Arnett DK, et al., 2019) is: 0.4%   Values used to calculate the score:     Age: 42 years     Sex: Female     Is Non-Hispanic African American: No     Diabetic: No     Tobacco smoker: No     Systolic Blood Pressure: 130 mmHg     Is BP treated: No     HDL Cholesterol: 58.1 mg/dL     Total Cholesterol: 149 mg/dL

## 2023-06-18 ENCOUNTER — Other Ambulatory Visit: Payer: Self-pay | Admitting: Family Medicine

## 2023-06-18 DIAGNOSIS — Z1231 Encounter for screening mammogram for malignant neoplasm of breast: Secondary | ICD-10-CM

## 2023-07-30 ENCOUNTER — Ambulatory Visit
Admission: RE | Admit: 2023-07-30 | Discharge: 2023-07-30 | Disposition: A | Discharge status: Home / Self Care | Source: Ambulatory Visit | Attending: Family Medicine | Admitting: Family Medicine

## 2023-07-30 DIAGNOSIS — Z1231 Encounter for screening mammogram for malignant neoplasm of breast: Secondary | ICD-10-CM

## 2023-08-18 ENCOUNTER — Encounter: Payer: Self-pay | Admitting: Family Medicine

## 2023-10-09 ENCOUNTER — Encounter: Payer: Self-pay | Admitting: Obstetrics and Gynecology

## 2023-10-09 ENCOUNTER — Ambulatory Visit (INDEPENDENT_AMBULATORY_CARE_PROVIDER_SITE_OTHER): Admitting: Obstetrics and Gynecology

## 2023-10-09 VITALS — BP 124/76 | HR 91 | Ht 64.5 in | Wt 147.0 lb

## 2023-10-09 DIAGNOSIS — Z1331 Encounter for screening for depression: Secondary | ICD-10-CM | POA: Diagnosis not present

## 2023-10-09 DIAGNOSIS — Z1211 Encounter for screening for malignant neoplasm of colon: Secondary | ICD-10-CM

## 2023-10-09 DIAGNOSIS — Z01419 Encounter for gynecological examination (general) (routine) without abnormal findings: Secondary | ICD-10-CM

## 2023-10-09 NOTE — Progress Notes (Signed)
 43 y.o. y.o. female here for annual exam. Patient's last menstrual period was 10/15/2016 (approximate).  [id number] Married.  Sons 16 and 26 yo.   RP:  Established patient presenting for annual gyn exam    HPI: TAH/Bilateral Salpingectomy for Fibroids 10/2016.  Patho benign. No pelvic pain.  No pain with IC.  Breasts normal. Mammo Neg 07/2023. Urine/BMs normal.  BMI 26.5. Good fitness with Peloton and Pilates. [facility] 04/2020 was normal at 8.8.  Health labs with Fam MD. Denies any abnormal pap smears Colonoscopy: referral placed for 9/25 at age of 32. Patient is aware.  Ikj:[id number]. No fractures, no family history of osteoporosis or fractures  Body mass index is 24.84 kg/m.     10/09/2023    9:34 AM 03/26/2023    9:22 AM 09/16/2022   11:17 AM  Depression screen PHQ 2/9  Decreased Interest 0 0 0  Down, Depressed, Hopeless 0 0 0  PHQ - 2 Score 0 0 0    Blood pressure 124/76, pulse 91, height 5' 4.5 (1.638 m), weight 147 lb (66.7 kg), last menstrual period 10/15/2016, SpO2 99%.  No results found for: DIAGPAP, HPVHIGH, ADEQPAP  GYN HISTORY: No results found for: DIAGPAP, HPVHIGH, ADEQPAP  OB History  Gravida Para Term Preterm AB Living  4 2 2  2 2   SAB IAB Ectopic Multiple Live Births  2    2    # Outcome Date GA Lbr Len/2nd Weight Sex Type Anes PTL Lv  4 Term 12/19/11 [redacted]w[redacted]d 02:19 / 00:10 6 lb 10.9 oz (3.03 kg) M Vag-Spont None  LIV  3 SAB 09/18/10          2 SAB 04/19/10          1 Term 04/20/07            Past Medical History:  Diagnosis Date   Atopic dermatitis 03/26/2023   Sees derm     Chronic allergic rhinitis 07/15/2019   Cyst (solitary) of breast left breast   patient denies this is a part of her medical history 10/28/16   Fibroid uterus    reason for hysterectomy   History of gestational diabetes    History of total hysterectomy 03/26/2023   For fibroids, complete and bilateral salpingoopherectomy      Past Surgical History:  Procedure Laterality  Date   ABDOMINAL HYSTERECTOMY N/A 11/05/2016   Procedure: TOTAL ABDOMINAL HYSTERECTOMY;  Surgeon: Lavoie, Marie-Lyne, MD;  Location: [facility];  Service: Gynecology;  Laterality: N/A;   BILATERAL SALPINGECTOMY Bilateral 11/05/2016   Procedure: BILATERAL SALPINGECTOMY;  Surgeon: Georgia Blonder, MD;  Location: [facility];  Service: Gynecology;  Laterality: Bilateral;   EYE SURGERY     2000 left eye   NASAL SINUS SURGERY  10/01/2019   ROBOTIC ASSISTED TOTAL HYSTERECTOMY WITH SALPINGECTOMY Bilateral 11/05/2016   Procedure: ATTEMPTED ROBOTIC ASSISTED TOTAL HYSTERECTOMY WITH BILATERAL SALPINGECTOMY;  Surgeon: Lavoie, Marie-Lyne, MD;  Location: [facility];  Service: Gynecology;  Laterality: Bilateral;    Current Outpatient Medications on File Prior to Visit  Medication Sig Dispense Refill   pimecrolimus (ELIDEL) 1 % cream SMARTSIG:sparingly Topical Twice Daily     triamcinolone  cream (KENALOG ) 0.1 % Apply 1 Application topically 2 (two) times daily. For 2 weeks (Patient not taking: Reported on 10/09/2023) 15 g 0   No current facility-administered medications on file prior to visit.    Social History   Socioeconomic History   Marital status: Married    Spouse name: Not on file  Number of children: 2   Years of education: Not on file   Highest education level: Not on file  Occupational History    Employer: [employer]  Tobacco Use   Smoking status: Never   Smokeless tobacco: Never  Vaping Use   Vaping status: Never Used  Substance and Sexual Activity   Alcohol use: Yes    Comment: social   Drug use: No   Sexual activity: Yes    Partners: Male    Birth control/protection: Surgical    Comment: hysterectomy  Other Topics Concern   Not on file  Social History Narrative   Not on file   Social Drivers of Health   Financial Resource Strain: Not on file  Food Insecurity: Not on file  Transportation Needs: Not on file  Physical Activity: Not on file  Stress: Not on file  Social  Connections: Not on file  Intimate Partner Violence: Not on file    Family History  Problem Relation Age of Onset   Brain cancer Maternal Grandmother    Cancer Other    COPD Other    Hyperlipidemia Other    Hypertension Mother    Cancer Maternal Grandfather        stomach   Stroke Paternal Grandmother    Diabetes Paternal Grandfather    Hearing loss Father      Allergies  Allergen Reactions   Sulfa Drugs Cross Reactors Other (See Comments)    Reaction unknown      Patient's last menstrual period was Patient's last menstrual period was 10/15/2016 (approximate)..             Review of Systems Alls systems reviewed and are negative.     OBGyn Exam    A:         Well Woman GYN exam                             P:        Pap smear not indicated Encouraged annual mammogram screening Colon cancer screening referral placed today DXA not indicated Labs and immunizations to do with PMD Discussed breast self exams Encouraged healthy lifestyle practices Encouraged Vit D and Calcium   No follow-ups on file.  Shannon Lawrence

## 2024-01-05 ENCOUNTER — Encounter: Payer: Self-pay | Admitting: Obstetrics and Gynecology

## 2024-01-26 ENCOUNTER — Encounter: Payer: Self-pay | Admitting: Family Medicine

## 2024-01-26 ENCOUNTER — Ambulatory Visit: Admitting: Family Medicine

## 2024-01-26 ENCOUNTER — Ambulatory Visit: Payer: Self-pay

## 2024-01-26 VITALS — BP 130/82 | HR 69 | Temp 98.1°F | Ht 64.5 in | Wt 151.6 lb

## 2024-01-26 DIAGNOSIS — S83411A Sprain of medial collateral ligament of right knee, initial encounter: Secondary | ICD-10-CM | POA: Diagnosis not present

## 2024-01-26 MED ORDER — DICLOFENAC SODIUM 75 MG PO TBEC: 75.0000 mg | DELAYED_RELEASE_TABLET | Freq: Two times a day (BID) | ORAL | 0 refills | Status: DC

## 2024-01-26 NOTE — Progress Notes (Signed)
 Subjective  CC:  Chief Complaint  Patient presents with   Knee Pain    Rt knee pain that started 1 1/2 week ago and has gotten worse    HPI: Shannon Lawrence is a 43 y.o. female who presents to the office today to address the problems listed above in the chief complaint. Discussed the use of AI scribe software for clinical note transcription with the patient, who gave verbal consent to proceed.  History of Present Illness Shannon Lawrence is a 43 year old female who presents with knee pain.  Knee pain - Onset after activities involving heavy lifting and prolonged standing - Pain began as a dull ache, worsening over the past two days - Pain now described as bruised to the touch and sometimes radiates down the leg - Pain is localized around the knee, initially thought to be diffuse but now more specific to one area - No swelling or increased heat of the skin - No prior similar episodes; knees are usually asymptomatic - No locking or giving way of the knee - Unable to fully bend the knee due to pain - Walking and prolonged standing exacerbate the pain - Sitting relieves the pain - Attempts to avoid limping, but finds it challenging  Associated neurological and musculoskeletal symptoms - Sensation of the leg almost going to sleep - Some discomfort in the toes - No symptoms involving the back of the calf, thigh, or hip  Response to interventions - Tylenol  and Advil  taken sporadically, usually two tablets at a time, with minimal relief - Knee brace purchased; provides a sense of caution but does not significantly alleviate pain   Assessment  1. Sprain of medial collateral ligament of right knee, initial encounter      Plan  Assessment and Plan Assessment & Plan Right knee medial collateral ligament sprain/strain Presents with right knee medial collateral ligament sprain/strain, likely due to recent physical activities involving heavy lifting and prolonged standing. Pain  is dull, worsening over the past two days, localized to the medial aspect of the knee. No swelling, heat, or locking of the knee, but pain upon bending. Physical examination suggests a strain or sprain of the medial ligament without evidence of bursitis or other complications. - Instruct to ice the affected area at least twice a day for ten minutes each time. - Prescribe a course of prescription-strength anti-inflammatory medication, to be taken twice daily for 7-10 days. - Advise continued use of the knee brace with a hole in the middle for support. - Instruct to report back if there is no improvement.    Follow up: prn No orders of the defined types were placed in this encounter.  Meds ordered this encounter  Medications   diclofenac (VOLTAREN) 75 MG EC tablet    Sig: Take 1 tablet (75 mg total) by mouth 2 (two) times daily.    Dispense:  30 tablet    Refill:  0     I reviewed the patients updated PMH, FH, and SocHx.  Patient Active Problem List   Diagnosis Date Noted   Atopic dermatitis 03/26/2023   History of total hysterectomy 03/26/2023   Chronic ethmoidal sinusitis 07/23/2019   Chronic maxillary sinusitis 07/23/2019   Chronic allergic rhinitis 07/15/2019   Tinea corporis 05/09/2016   Current Meds  Medication Sig   diclofenac (VOLTAREN) 75 MG EC tablet Take 1 tablet (75 mg total) by mouth 2 (two) times daily.   Allergies: Patient is allergic to sulfa drugs  cross reactors. Family History: Patient family history includes Brain cancer in her maternal grandmother; COPD in an other family member; Cancer in her maternal grandfather and another family member; Diabetes in her paternal grandfather; Hearing loss in her father; Hyperlipidemia in an other family member; Hypertension in her mother; Stroke in her paternal grandmother. Social History:  Patient  reports that she has never smoked. She has never used smokeless tobacco. She reports current alcohol use. She reports that she  does not use drugs.  Review of Systems: Constitutional: Negative for fever malaise or anorexia Cardiovascular: negative for chest pain Respiratory: negative for SOB or persistent cough Gastrointestinal: negative for abdominal pain  Objective  Vitals: BP 130/82   Pulse 69   Temp 98.1 F (36.7 C)   Ht 5' 4.5 (1.638 m)   Wt 151 lb 9.6 oz (68.8 kg)   LMP 10/15/2016 (Approximate)   SpO2 99%   BMI 25.62 kg/m  General: no acute distress , A&Ox3 Right knee: pain w/ mcl strain, no warmth or redness, FROM, neg lachmans.  Commons side effects, risks, benefits, and alternatives for medications and treatment plan prescribed today were discussed, and the patient expressed understanding of the given instructions. Patient is instructed to call or message via MyChart if he/she has any questions or concerns regarding our treatment plan. No barriers to understanding were identified. We discussed Red Flag symptoms and signs in detail. Patient expressed understanding regarding what to do in case of urgent or emergency type symptoms.  Medication list was reconciled, printed and provided to the patient in AVS. Patient instructions and summary information was reviewed with the patient as documented in the AVS. This note was prepared with assistance of Dragon voice recognition software. Occasional wrong-word or sound-a-like substitutions may have occurred due to the inherent limitations of voice recognition software

## 2024-01-26 NOTE — Telephone Encounter (Signed)
 FYI Only or Action Required?: FYI only for provider.  Patient was last seen in primary care on 03/26/2023 by Jodie Lavern CROME, MD.  Called Nurse Triage reporting Knee Pain.  Symptoms began a week ago.  Interventions attempted: OTC medications: Tylenol  and ibuprofen .  Symptoms are: gradually worsening.  Triage Disposition: See PCP When Office is Open (Within 3 Days)  Patient/caregiver understands and will follow disposition?: Yes  Copied from CRM #[id number]. Topic: Clinical - Red Word Triage >> Jan 26, 2024  9:37 AM Mia F wrote: Red Word that prompted transfer to Nurse Triage: Knee pain that is getting worse. Feels bruised under kneecap. There is some swelling but not too much. Reason for Disposition  [1] MODERATE pain (e.g., interferes with normal activities, limping) AND [2] present > 3 days  Answer Assessment - Initial Assessment Questions 1. LOCATION and RADIATION: Where is the pain located?      Right knee  2. QUALITY: What does the pain feel like?  (e.g., sharp, dull, aching, burning)     Dull ache, feels bruised  3. SEVERITY: How bad is the pain? What does it keep you from doing?   (Scale 1-10; or mild, moderate, severe)     6/10  4. ONSET: When did the pain start? Does it come and go, or is it there all the time?     1 week, has gotten worse over past 2 days  5. RECURRENT: Have you had this pain before? If Yes, ask: When, and what happened then?     No  6. SETTING: Has there been any recent work, exercise or other activity that involved that part of the body?      Recently exerted self doing heavy lifting at a pumpkin patch 1.5 weeks ago  7. AGGRAVATING FACTORS: What makes the knee pain worse? (e.g., walking, climbing stairs, running)     Walking, up and down stair  8. ASSOCIATED SYMPTOMS: Is there any swelling or redness of the knee?     Mild swelling, no redness  9. OTHER SYMPTOMS: Do you have any other symptoms? (e.g., calf pain, chest  pain, difficulty breathing, fever)     No  Protocols used: Knee Pain-A-AH

## 2024-01-26 NOTE — Patient Instructions (Signed)
 Please follow up if symptoms do not improve or as needed.    Knee Sprain, Adult  A knee sprain is a stretch or tear in a knee ligament. Knee ligaments are tissues that connect the bones of the knee joint to each other. What are the causes? This condition often results from: A fall. An injury to the knee. What are the signs or symptoms? Symptoms of this condition include: Trouble straightening or bending the leg. Swelling in the knee. Bruising around the knee. Tenderness or pain in the knee. Sudden muscle tightening (spasm) around the knee. How is this diagnosed? This condition may be diagnosed based on: A physical exam. A history of what happened just before you started to have symptoms. Tests. These may include: An X-ray. This may be done to make sure no bones are broken or moved out of position (dislocated). An MRI. This may be done to check if any of the ligaments are torn and to check for other damage. A physical exam that includes stress testing of the knee. This may be done to check for damage to ligaments. How is this treated? Treatment for this condition may involve: Keeping the knee in a straightened position (immobilized) with a cast, brace, or splint. Applying ice to the knee. This helps with pain and swelling. Raising (elevating) the knee above the level of your heart when you are resting. This helps with pain and swelling. Taking medicine for pain. Doing exercises to prevent or limit long-term weakness or stiffness in your knee. Having surgery to reconnect ligaments to the bone or to reconstruct ligaments. This may be needed if one or more ligaments are fully torn. Follow these instructions at home: If you have a removable splint or brace: Wear the splint or brace as told by your health care provider. Remove it only as told by your health care provider. Check the skin around the splint or brace every day. Tell your health care provider about any concerns. Loosen the  splint or brace if your toes tingle, become numb, or turn cold and blue. Keep the splint or brace clean and dry. If you have a nonremovable cast: Do not put pressure on any part of the cast until it is fully hardened. This may take several hours. Do not stick anything inside the cast to scratch your skin. Doing that increases your risk of infection. Check the skin around the cast every day. Tell your health care provider about any concerns. You may put lotion on dry skin around the edges of the cast. Do not put lotion on the skin underneath the cast. Keep the cast clean and dry. Bathing If the splint, brace, or cast is not waterproof: Do not let it get wet. Cover it with a watertight covering when you take a bath or a shower. Managing pain, stiffness, and swelling  If directed, put ice on the injured area. To do this: If you have a removable splint or brace, remove it as told by your health care provider. Put ice in a plastic bag. Place a towel between your skin and the bag or between your cast and the bag. Leave the ice on for 20 minutes, 2-3 times a day. If your skin turns bright red, remove the ice right away to prevent skin damage. The risk of skin damage is higher if you cannot feel pain, heat, or cold. Move your toes often to reduce stiffness and swelling. Elevate the injured area above the level of your heart while you  are sitting or lying down. General instructions Take over-the-counter and prescription medicines only as told by your health care provider. Do not use any products that contain nicotine or tobacco. These products include cigarettes, chewing tobacco, and vaping devices, such as e-cigarettes. These can delay healing. If you need help quitting, ask your health care provider. Do exercises as told by your health care provider. Contact a health care provider if: You have pain that gets worse. The cast, brace, or splint does not fit right or gets damaged. Get help right  away if: You cannot use your injured knee to support any of your body weight (cannot bear weight). You cannot move the injured joint. You cannot walk more than a few steps without pain or without your knee buckling. You have a lot of pain, swelling, or numbness in the leg below the cast, brace, or splint. Your foot or toes are numb, cold, or blue after you loosen your splint or brace. This information is not intended to replace advice given to you by your health care provider. Make sure you discuss any questions you have with your health care provider. Document Revised: 09/17/2021 Document Reviewed: 09/17/2021 Elsevier Patient Education  2024 [employer].

## 2024-01-27 ENCOUNTER — Ambulatory Visit: Admitting: Family Medicine

## 2024-02-27 ENCOUNTER — Ambulatory Visit (AMBULATORY_SURGERY_CENTER)

## 2024-02-27 VITALS — Ht 64.5 in | Wt 151.6 lb

## 2024-02-27 DIAGNOSIS — Z1211 Encounter for screening for malignant neoplasm of colon: Secondary | ICD-10-CM

## 2024-02-27 MED ORDER — NA SULFATE-K SULFATE-MG SULF 17.5-3.13-1.6 GM/177ML PO SOLN: 1.0000 | Freq: Once | ORAL | 0 refills | Status: AC

## 2024-02-27 MED ORDER — ONDANSETRON HCL 4 MG PO TABS: 4.0000 mg | ORAL_TABLET | Freq: Three times a day (TID) | ORAL | 1 refills | Status: DC | PRN

## 2024-02-27 NOTE — Progress Notes (Signed)

## 2024-03-12 ENCOUNTER — Encounter: Payer: Self-pay | Admitting: Pediatrics

## 2024-03-16 NOTE — Progress Notes (Unsigned)
 [facility] History and Physical   Primary Care Physician:  Jodie Lavern CROME, MD   Reason for Procedure:  Colorectal cancer screening  Plan:    Colonoscopy   The patient was provided an opportunity to ask questions and all were answered. The patient agreed with the plan.   HPI: Shannon Lawrence is a 43 y.o. female undergoing screening colonoscopy for colorectal cancer screening.  This is the patient's first colonoscopy.  There is a family history of colon polyps in the patient's mother.  No family history of colorectal cancer.  Patient denies current symptoms of rectal bleeding or change in bowel habits.   Past Medical History:  Diagnosis Date   Allergy    Atopic dermatitis 03/26/2023   Sees derm     Cataract 2000   traumatic cataract   Chronic allergic rhinitis 07/15/2019   Cyst (solitary) of breast left breast   patient denies this is a part of her medical history 10/28/16   Fibroid uterus    reason for hysterectomy   History of gestational diabetes    History of total hysterectomy 03/26/2023   For fibroids, complete and bilateral salpingoopherectomy      Past Surgical History:  Procedure Laterality Date   ABDOMINAL HYSTERECTOMY N/A 11/05/2016   Procedure: TOTAL ABDOMINAL HYSTERECTOMY;  Surgeon: Lavoie, Marie-Lyne, MD;  Location: [facility];  Service: Gynecology;  Laterality: N/A;   BILATERAL SALPINGECTOMY Bilateral 11/05/2016   Procedure: BILATERAL SALPINGECTOMY;  Surgeon: Georgia Blonder, MD;  Location: [facility];  Service: Gynecology;  Laterality: Bilateral;   EYE SURGERY     2000 left eye   NASAL SINUS SURGERY  10/01/2019   ROBOTIC ASSISTED TOTAL HYSTERECTOMY WITH SALPINGECTOMY Bilateral 11/05/2016   Procedure: ATTEMPTED ROBOTIC ASSISTED TOTAL HYSTERECTOMY WITH BILATERAL SALPINGECTOMY;  Surgeon: Lavoie, Marie-Lyne, MD;  Location: [facility];  Service: Gynecology;  Laterality: Bilateral;    Prior to Admission medications   Medication Sig Start Date End Date  Taking? Authorizing Provider  diclofenac  (VOLTAREN ) 75 MG EC tablet Take 1 tablet (75 mg total) by mouth 2 (two) times daily. Patient not taking: Reported on 02/27/2024 01/26/24   Jodie Lavern CROME, MD  ondansetron  (ZOFRAN ) 4 MG tablet Take 1 tablet (4 mg total) by mouth every 8 (eight) hours as needed for nausea or vomiting. 02/27/24   Shannon Shannon HERO, MD  pimecrolimus (ELIDEL) 1 % cream SMARTSIG:sparingly Topical Twice Daily Patient not taking: Reported on 01/26/2024 05/14/23   [provider]  triamcinolone  cream (KENALOG ) 0.1 % Apply 1 Application topically 2 (two) times daily. For 2 weeks Patient not taking: Reported on 01/26/2024 07/10/22   Jodie Lavern CROME, MD    Current Outpatient Medications  Medication Sig Dispense Refill   diclofenac  (VOLTAREN ) 75 MG EC tablet Take 1 tablet (75 mg total) by mouth 2 (two) times daily. (Patient not taking: Reported on 02/27/2024) 30 tablet 0   ondansetron  (ZOFRAN ) 4 MG tablet Take 1 tablet (4 mg total) by mouth every 8 (eight) hours as needed for nausea or vomiting. 30 tablet 1   pimecrolimus (ELIDEL) 1 % cream SMARTSIG:sparingly Topical Twice Daily (Patient not taking: Reported on 01/26/2024)     triamcinolone  cream (KENALOG ) 0.1 % Apply 1 Application topically 2 (two) times daily. For 2 weeks (Patient not taking: Reported on 01/26/2024) 15 g 0   No current facility-administered medications for this visit.    Allergies as of 03/19/2024 - Review Complete 02/27/2024  Allergen Reaction Noted   Sulfa drugs cross reactors Other (See Comments) 10/24/2011  Family History  Problem Relation Age of Onset   Colon polyps Mother    Hypertension Mother    Hearing loss Father    Brain cancer Maternal Grandmother    Cancer Maternal Grandfather        stomach   Stroke Paternal Grandmother    Diabetes Paternal Grandfather    Cancer Other    COPD Other    Hyperlipidemia Other    Colon cancer Neg Hx    Esophageal cancer Neg Hx    Rectal cancer Neg  Hx    Stomach cancer Neg Hx     Social History   Socioeconomic History   Marital status: Married    Spouse name: Not on file   Number of children: 2   Years of education: Not on file   Highest education level: Not on file  Occupational History    Employer: [employer]  Tobacco Use   Smoking status: Never   Smokeless tobacco: Never  Vaping Use   Vaping status: Never Used  Substance and Sexual Activity   Alcohol use: Yes    Alcohol/week: 14.0 standard drinks of alcohol    Types: 14 Cans of beer per week    Comment: social   Drug use: No   Sexual activity: Yes    Partners: Male    Birth control/protection: Surgical    Comment: hysterectomy  Other Topics Concern   Not on file  Social History Narrative   Not on file   Social Drivers of Health   Financial Resource Strain: Not on file  Food Insecurity: Not on file  Transportation Needs: Not on file  Physical Activity: Not on file  Stress: Not on file  Social Connections: Not on file  Intimate Partner Violence: Not on file    Review of Systems:  All other review of systems negative except as mentioned in the HPI.  Physical Exam: Vital signs LMP 10/15/2016 (Approximate)   General:   Alert,  Well-developed, well-nourished, pleasant and cooperative in NAD Airway:  Mallampati  Lungs:  Clear throughout to auscultation.   Heart:  Regular rate and rhythm; no murmurs, clicks, rubs,  or gallops. Abdomen:  Soft, nontender and nondistended. Normal bowel sounds.   Neuro/Psych:  Normal mood and affect. A and O x 3  Shannon Hausen, MD [facility]

## 2024-03-19 ENCOUNTER — Encounter: Payer: Self-pay | Admitting: Pediatrics

## 2024-03-19 ENCOUNTER — Ambulatory Visit: Admitting: Pediatrics

## 2024-03-19 VITALS — BP 108/64 | HR 71 | Temp 97.9°F | Resp 11 | Ht 64.5 in | Wt 151.6 lb

## 2024-03-19 DIAGNOSIS — Z1211 Encounter for screening for malignant neoplasm of colon: Secondary | ICD-10-CM

## 2024-03-19 DIAGNOSIS — D128 Benign neoplasm of rectum: Secondary | ICD-10-CM

## 2024-03-19 DIAGNOSIS — Z83719 Family history of colon polyps, unspecified: Secondary | ICD-10-CM

## 2024-03-19 DIAGNOSIS — K621 Rectal polyp: Secondary | ICD-10-CM | POA: Diagnosis not present

## 2024-03-19 MED ORDER — SODIUM CHLORIDE 0.9 % IV SOLN: 500.0000 mL | Freq: Once | INTRAVENOUS | Status: DC

## 2024-03-19 NOTE — Progress Notes (Signed)
 Report to PACU, RN, vss, BBS= Clear.

## 2024-03-19 NOTE — Progress Notes (Signed)
VS by MO    Pt's states no medical or surgical changes since previsit or office visit.

## 2024-03-19 NOTE — Progress Notes (Signed)
 Called to room to assist during endoscopic procedure.  Patient ID and intended procedure confirmed with present staff. Received instructions for my participation in the procedure from the performing physician.

## 2024-03-19 NOTE — Patient Instructions (Signed)
 Handouts given: Polyps, Diverticulosis Resume previous diet. Continue present medications.  Await pathology results. Repeat colonoscopy for surveillance based on pathology results.  YOU HAD AN ENDOSCOPIC PROCEDURE TODAY AT THE [facility]:   Refer to the procedure report that was given to you for any specific questions about what was found during the examination.  If the procedure report does not answer your questions, please call your gastroenterologist to clarify.  If you requested that your care partner not be given the details of your procedure findings, then the procedure report has been included in a sealed envelope for you to review at your convenience later.  YOU SHOULD EXPECT: Some feelings of bloating in the abdomen. Passage of more gas than usual.  Walking can help get rid of the air that was put into your GI tract during the procedure and reduce the bloating. If you had a lower endoscopy (such as a colonoscopy or flexible sigmoidoscopy) you may notice spotting of blood in your stool or on the toilet paper. If you underwent a bowel prep for your procedure, you may not have a normal bowel movement for a few days.  Please Note:  You might notice some irritation and congestion in your nose or some drainage.  This is from the oxygen used during your procedure.  There is no need for concern and it should clear up in a day or so.  SYMPTOMS TO REPORT IMMEDIATELY:  Following lower endoscopy (colonoscopy or flexible sigmoidoscopy):  Excessive amounts of blood in the stool  Significant tenderness or worsening of abdominal pains  Swelling of the abdomen that is new, acute  Fever of 100F or higher  For urgent or emergent issues, a gastroenterologist can be reached at any hour by calling [phone]. Do not use MyChart messaging for urgent concerns.    DIET:  We do recommend a small meal at first, but then you may proceed to your regular diet.  Drink plenty of fluids but you  should avoid alcoholic beverages for 24 hours.  ACTIVITY:  You should plan to take it easy for the rest of today and you should NOT DRIVE or use heavy machinery until tomorrow (because of the sedation medicines used during the test).    FOLLOW UP: Our staff will call the number listed on your records the next business day following your procedure.  We will call around 7:15- 8:00 am to check on you and address any questions or concerns that you may have regarding the information given to you following your procedure. If we do not reach you, we will leave a message.     If any biopsies were taken you will be contacted by phone or by letter within the next 1-3 weeks.  Please call us  at [phone] if you have not heard about the biopsies in 3 weeks.    SIGNATURES/CONFIDENTIALITY: You and/or your care partner have signed paperwork which will be entered into your electronic medical record.  These signatures attest to the fact that that the information above on your After Visit Summary has been reviewed and is understood.  Full responsibility of the confidentiality of this discharge information lies with you and/or your care-partner.

## 2024-03-19 NOTE — Op Note (Signed)
 [facility] Patient Name: Shannon Lawrence Procedure Date: 03/19/2024 9:26 AM MRN: [id number] Endoscopist: Inocente Hausen , MD, [id number] Age: 43 Referring MD:  Date of Birth: [birth year] Gender: Female Account #: [id number] Procedure:                Colonoscopy Indications:              Colon cancer screening in patient at increased                            risk: Family history of 1st-degree relative with                            colon polyps, This is the patient's first                            colonoscopy Medicines:                Monitored Anesthesia Care Procedure:                Pre-Anesthesia Assessment:                           - Prior to the procedure, a History and Physical                            was performed, and patient medications and                            allergies were reviewed. The patient's tolerance of                            previous anesthesia was also reviewed. The risks                            and benefits of the procedure and the sedation                            options and risks were discussed with the patient.                            All questions were answered, and informed consent                            was obtained. Prior Anticoagulants: The patient has                            taken no anticoagulant or antiplatelet agents. ASA                            Grade Assessment: I - A normal, healthy patient.                            After reviewing the risks and benefits, the patient  was deemed in satisfactory condition to undergo the                            procedure.                           After obtaining informed consent, the colonoscope                            was passed under direct vision. Throughout the                            procedure, the patient's blood pressure, pulse, and                            oxygen saturations were monitored continuously. The                             [id number]-HQ190L Colonoscope [id number] was introduced                            through the anus and advanced to the cecum,                            identified by appendiceal orifice and ileocecal                            valve. The colonoscopy was performed without                            difficulty. The patient tolerated the procedure                            well. The quality of the bowel preparation was                            good. The ileocecal valve, appendiceal orifice, and                            rectum were photographed. Scope In: 9:35:47 AM Scope Out: 9:47:00 AM Scope Withdrawal Time: 0 hours 9 minutes 44 seconds  Total Procedure Duration: 0 hours 11 minutes 13 seconds  Findings:                 The perianal and digital rectal examinations were                            normal. Pertinent negatives include normal                            sphincter tone and no palpable rectal lesions.                           A few medium-mouthed and small-mouthed diverticula  were found in the sigmoid colon, descending colon                            and cecum.                           A 3 mm polyp was found in the rectum. The polyp was                            sessile. The polyp was removed with a cold biopsy                            forceps. Resection and retrieval were complete.                           The retroflexed view of the distal rectum and anal                            verge was normal and showed no anal or rectal                            abnormalities. Complications:            No immediate complications. Estimated blood loss:                            Minimal. Estimated Blood Loss:     Estimated blood loss was minimal. Impression:               - Diverticulosis in the sigmoid colon, in the                            descending colon and in the cecum.                           - One 3 mm polyp in the rectum, removed with a  cold                            biopsy forceps. Resected and retrieved. Recommendation:           - Discharge patient to home (ambulatory).                           - Await pathology results.                           - Repeat colonoscopy for surveillance based on                            pathology results.                           - The findings and recommendations were discussed                            with the patient's  family.                           - Return to referring physician.                           - Patient has a contact number available for                            emergencies. The signs and symptoms of potential                            delayed complications were discussed with the                            patient. Return to normal activities tomorrow.                            Written discharge instructions were provided to the                            patient. Inocente Hausen, MD 03/19/2024 9:57:08 AM This report has been signed electronically.

## 2024-03-22 ENCOUNTER — Telehealth: Payer: Self-pay

## 2024-03-22 NOTE — Telephone Encounter (Signed)
 Attempted f/u call. No answer, left VM.

## 2024-03-23 LAB — SURGICAL PATHOLOGY

## 2024-03-24 ENCOUNTER — Ambulatory Visit: Payer: Self-pay | Admitting: Pediatrics

## 2024-03-29 ENCOUNTER — Ambulatory Visit: Payer: 59 | Admitting: Family Medicine

## 2024-03-29 ENCOUNTER — Encounter: Payer: Self-pay | Admitting: Family Medicine

## 2024-03-29 VITALS — BP 116/72 | HR 71 | Temp 98.2°F | Resp 16 | Ht 64.0 in | Wt 152.8 lb

## 2024-03-29 DIAGNOSIS — Z90710 Acquired absence of both cervix and uterus: Secondary | ICD-10-CM

## 2024-03-29 DIAGNOSIS — K635 Polyp of colon: Secondary | ICD-10-CM

## 2024-03-29 DIAGNOSIS — Z0000 Encounter for general adult medical examination without abnormal findings: Secondary | ICD-10-CM

## 2024-03-29 LAB — CBC WITH DIFFERENTIAL/PLATELET
Basophils Absolute: 0.1 K/uL (ref 0.0–0.1)
Basophils Relative: 1 % (ref 0.0–3.0)
Eosinophils Absolute: 0.5 K/uL (ref 0.0–0.7)
Eosinophils Relative: 7.2 % — ABNORMAL HIGH (ref 0.0–5.0)
HCT: 40.9 % (ref 36.0–46.0)
Hemoglobin: 14.1 g/dL (ref 12.0–15.0)
Lymphocytes Relative: 21.7 % (ref 12.0–46.0)
Lymphs Abs: 1.5 K/uL (ref 0.7–4.0)
MCHC: 34.5 g/dL (ref 30.0–36.0)
MCV: 92.5 fl (ref 78.0–100.0)
Monocytes Absolute: 0.5 K/uL (ref 0.1–1.0)
Monocytes Relative: 6.6 % (ref 3.0–12.0)
Neutro Abs: 4.5 K/uL (ref 1.4–7.7)
Neutrophils Relative %: 63.5 % (ref 43.0–77.0)
Platelets: 359 K/uL (ref 150.0–400.0)
RBC: 4.42 Mil/uL (ref 3.87–5.11)
RDW: 11.9 % (ref 11.5–15.5)
WBC: 7 K/uL (ref 4.0–10.5)

## 2024-03-29 LAB — COMPREHENSIVE METABOLIC PANEL WITH GFR
ALT: 12 U/L (ref 3–35)
AST: 14 U/L (ref 5–37)
Albumin: 4.3 g/dL (ref 3.5–5.2)
Alkaline Phosphatase: 51 U/L (ref 39–117)
BUN: 15 mg/dL (ref 6–23)
CO2: 27 meq/L (ref 19–32)
Calcium: 9.4 mg/dL (ref 8.4–10.5)
Chloride: 102 meq/L (ref 96–112)
Creatinine, Ser: 0.84 mg/dL (ref 0.40–1.20)
GFR: 84.01 mL/min
Glucose, Bld: 73 mg/dL (ref 70–99)
Potassium: 4.6 meq/L (ref 3.5–5.1)
Sodium: 139 meq/L (ref 135–145)
Total Bilirubin: 0.6 mg/dL (ref 0.2–1.2)
Total Protein: 6.6 g/dL (ref 6.0–8.3)

## 2024-03-29 LAB — LIPID PANEL
Cholesterol: 153 mg/dL (ref 28–200)
HDL: 60.1 mg/dL
LDL Cholesterol: 79 mg/dL (ref 10–99)
NonHDL: 93.24
Total CHOL/HDL Ratio: 3
Triglycerides: 72 mg/dL (ref 10.0–149.0)
VLDL: 14.4 mg/dL (ref 0.0–40.0)

## 2024-03-29 LAB — TSH: TSH: 1.05 u[IU]/mL (ref 0.35–5.50)

## 2024-03-29 NOTE — Progress Notes (Signed)
 " Subjective  Chief Complaint  Patient presents with   Annual Exam    No questions or concerns at this time.    HPI: Shannon Lawrence is a 43 y.o. female who presents to [facility] [place] today for a Female Wellness Visit. She also has the concerns and/or needs as listed above in the chief complaint. These will be addressed in addition to the Health Maintenance Visit.   Wellness Visit: annual visit with health maintenance review and exam  HM: healthy lifestyle. Reviewed GYN: pap and mammo current. Reviewed recent colonoscopy: hyperplastic colon polyp routine screening recommended. Declines flu vaccine.   Chronic disease f/u and/or acute problem visit: (deemed necessary to be done in addition to the wellness visit): Discussed the use of AI scribe software for clinical note transcription with the patient, who gave verbal consent to proceed.  History of Present Illness Shannon Lawrence is a 43 year old female who presents for a follow-up visit after her first colonoscopy.  Colorectal screening and gastrointestinal findings - Underwent first colonoscopy recently - One polyp identified and diagnosed as benign hyperplastic - Diverticulosis present - Bowel preparation was manageable but unpleasant - Preparation involved smaller volume solution compared to her husband's experience  Post-hysterectomy status and hormonal management - History of hysterectomy with ovarian conservation - Not currently on hormone therapy  Weight management and physical activity - Desires further weight loss - Attempts to increase physical activity - Owns a Peloton bike and participates in Pilates and strength training classes - Difficulty maintaining motivation and consistency with exercise     Assessment  1. Annual physical exam   2. Hyperplastic colonic polyp, unspecified part of colon      Plan  Female Wellness Visit: Age appropriate Health Maintenance and Prevention measures  were discussed with patient. Included topics are cancer screening recommendations, ways to keep healthy (see AVS) including dietary and exercise recommendations, regular eye and dental care, use of seat belts, and avoidance of moderate alcohol use and tobacco use.  BMI: discussed patient's BMI and encouraged positive lifestyle modifications to help get to or maintain a target BMI. HM needs and immunizations were addressed and ordered. See below for orders. See HM and immunization section for updates. Routine labs and screening tests ordered including cmp, cbc and lipids where appropriate. Discussed recommendations regarding Vit D and calcium supplementation (see AVS)  Chronic disease management visit and/or acute problem visit: Assessment and Plan Assessment & Plan Colonic polyp Recent colonoscopy revealed a benign colonic polyp with no immediate concerns.  Diverticulosis of colon Diverticulosis identified during colonoscopy.  General Health Maintenance Discussed flu vaccination and weight management. She has not received a flu shot this year and expressed interest in increasing physical activity. - Encouraged regular physical activity and weight management    Follow up: 1 year  Orders Placed This Encounter  Procedures   CBC with Differential/Platelet   Comprehensive metabolic panel with GFR   Lipid panel   TSH   No orders of the defined types were placed in this encounter.     Body mass index is 26.23 kg/m. Wt Readings from Last 3 Encounters:  03/29/24 152 lb 12.8 oz (69.3 kg)  03/19/24 151 lb 9.6 oz (68.8 kg)  02/27/24 151 lb 9.6 oz (68.8 kg)     Patient Active Problem List   Diagnosis Date Noted Date Diagnosed   Chronic ethmoidal sinusitis 07/23/2019     Priority: Medium    Chronic maxillary sinusitis 07/23/2019  Priority: Medium    Hyperplastic colon polyp 03/29/2024     Priority: Low    Dr. Suzann, colonoscopy 03/2024; repeat 10 years. Benign.    Atopic  dermatitis 03/26/2023     Priority: Low    Sees derm    History of total hysterectomy 03/26/2023     Priority: Low    For fibroids, complete and bilateral salpingoopherectomy    Chronic allergic rhinitis 07/15/2019     Priority: Low   Health Maintenance  Topic Date Due   COVID-19 Vaccine (3 - 2025-26 season) 04/14/2024 (Originally 12/08/2023)   Influenza Vaccine  07/06/2024 (Originally 11/07/2023)   Hepatitis B Vaccines 19-59 Average Risk (1 of 3 - 19+ 3-dose series) 03/29/2025 (Originally 12/22/1997)   HPV VACCINES (1 - 3-dose SCDM series) 03/29/2025 (Originally 12/22/2005)   Mammogram  07/29/2024   DTaP/Tdap/Td (4 - Td or Tdap) 03/25/2033   Colonoscopy  03/19/2034   Hepatitis C Screening  Completed   HIV Screening  Completed   Pneumococcal Vaccine  Aged Out   Meningococcal B Vaccine  Aged Out   Immunization History  Administered Date(s) Administered   PFIZER(Purple Top)SARS-COV-2 Vaccination 09/04/2019, 09/25/2019   Tdap 08/16/2011, 03/26/2023, 03/26/2023   We updated and reviewed the patient's past history in detail and it is documented below. Allergies: Patient is allergic to sulfa drugs cross reactors. Past Medical History Patient  has a past medical history of Allergy, Atopic dermatitis (03/26/2023), Cataract (2000), Chronic allergic rhinitis (07/15/2019), Cyst (solitary) of breast (left breast), Fibroid uterus, History of gestational diabetes, and History of total hysterectomy (03/26/2023). Past Surgical History Patient  has a past surgical history that includes Eye surgery; Robotic assisted total hysterectomy with salpingectomy (Bilateral, 11/05/2016); Abdominal hysterectomy (N/A, 11/05/2016); Bilateral salpingectomy (Bilateral, 11/05/2016); and Nasal sinus surgery (10/01/2019). Family History: Patient family history includes Brain cancer in her maternal grandmother; COPD in an other family member; Cancer in her maternal grandfather and another family member; Colon polyps in her  mother; Diabetes in her paternal grandfather; Hearing loss in her father; Hyperlipidemia in an other family member; Hypertension in her mother; Stroke in her paternal grandmother. Social History:  Patient  reports that she has never smoked. She has never used smokeless tobacco. She reports current alcohol use of about 14.0 standard drinks of alcohol per week. She reports that she does not use drugs.  Review of Systems: Constitutional: negative for fever or malaise Ophthalmic: negative for photophobia, double vision or loss of vision Cardiovascular: negative for chest pain, dyspnea on exertion, or new LE swelling Respiratory: negative for SOB or persistent cough Gastrointestinal: negative for abdominal pain, change in bowel habits or melena Genitourinary: negative for dysuria or gross hematuria, no abnormal uterine bleeding or disharge Musculoskeletal: negative for new gait disturbance or muscular weakness Integumentary: negative for new or persistent rashes, no breast lumps Neurological: negative for TIA or stroke symptoms Psychiatric: negative for SI or delusions Allergic/Immunologic: negative for hives  Patient Care Team    Relationship Specialty Notifications Start End  Jodie Lavern CROME, MD PCP - General Family Medicine  07/15/19   Carlie Clark, MD Consulting Physician Otolaryngology  12/31/19   Suzann Inocente HERO, MD Consulting Physician Gastroenterology  03/29/24   Glennon Almarie POUR, MD Consulting Physician Obstetrics and Gynecology  03/29/24     Objective  Vitals: BP 116/72   Pulse 71   Temp 98.2 F (36.8 C) (Temporal)   Resp 16   Ht 5' 4 (1.626 m)   Wt 152 lb 12.8 oz (69.3 kg)  LMP 10/15/2016   SpO2 99%   BMI 26.23 kg/m  General:  Well developed, well nourished, no acute distress  Psych:  Alert and orientedx3,normal mood and affect HEENT:  Normocephalic, atraumatic, non-icteric sclera,  supple neck without adenopathy, mass or thyromegaly Cardiovascular:  Normal S1, S2,  RRR without gallop, rub or murmur Respiratory:  Good breath sounds bilaterally, CTAB with normal respiratory effort Gastrointestinal: normal bowel sounds, soft, non-tender, no noted masses. No HSM MSK: extremities without edema, joints without erythema or swelling Neurologic:    Mental status is normal.  Gross motor and sensory exams are normal.  No tremor  Commons side effects, risks, benefits, and alternatives for medications and treatment plan prescribed today were discussed, and the patient expressed understanding of the given instructions. Patient is instructed to call or message via MyChart if he/she has any questions or concerns regarding our treatment plan. No barriers to understanding were identified. We discussed Red Flag symptoms and signs in detail. Patient expressed understanding regarding what to do in case of urgent or emergency type symptoms.  Medication list was reconciled, printed and provided to the patient in AVS. Patient instructions and summary information was reviewed with the patient as documented in the AVS. This note was prepared with assistance of Dragon voice recognition software. Occasional wrong-word or sound-a-like substitutions may have occurred due to the inherent limitations of voice recognition software     "

## 2024-03-29 NOTE — Patient Instructions (Signed)
 Please return in 12 months for your annual complete physical; please come fasting.   I will release your lab results to you on your MyChart account with further instructions. You may see the results before I do, but when I review them I will send you a message with my report or have my assistant call you if things need to be discussed. Please reply to my message with any questions. Thank you!   If you have any questions or concerns, please don't hesitate to send me a message via MyChart or call the office at [phone]. Thank you for visiting with us  today! It's our pleasure caring for you.   Please do these things to maintain good health!  Exercise at least 30-45 minutes a day,  4-5 days a week.  Eat a low-fat diet with lots of fruits and vegetables, up to 7-9 servings per day. Drink plenty of water daily. Try to drink 8 8oz glasses per day. Seatbelts can save your life. Always wear your seatbelt. Place Smoke Detectors on every level of your home and check batteries every year. Schedule an appointment with an eye doctor for an eye exam every 1-2 years Safe sex - use condoms to protect yourself from STDs if you could be exposed to these types of infections. Use birth control if you do not want to become pregnant and are sexually active. Avoid heavy alcohol use. If you drink, keep it to less than 2 drinks/day and not every day. Health Care Power of Attorney.  Choose someone you trust that could speak for you if you became unable to speak for yourself. Depression is common in our stressful world.If you're feeling down or losing interest in things you normally enjoy, please come in for a visit. If anyone is threatening or hurting you, please get help. Physical or Emotional Violence is never OK.

## 2024-04-02 ENCOUNTER — Ambulatory Visit: Payer: Self-pay | Admitting: Family Medicine

## 2024-04-02 NOTE — Progress Notes (Signed)
 Labs reviewed.  The 10-year ASCVD risk score (Arnett DK, et al., 2019) is: 0.4%   Values used to calculate the score:     Age: 43 years     Clinically relevant sex: Female     Is Non-Hispanic African American: No     Diabetic: No     Tobacco smoker: No     Systolic Blood Pressure: 116 mmHg     Is BP treated: No     HDL Cholesterol: 60.1 mg/dL     Total Cholesterol: 153 mg/dL

## 2024-10-12 ENCOUNTER — Ambulatory Visit: Admitting: Obstetrics and Gynecology

## 2025-03-30 ENCOUNTER — Encounter: Admitting: Family Medicine
